# Patient Record
Sex: Male | Born: 1970 | Race: White | Hispanic: No | Marital: Married | Smoking: Former smoker
Health system: Southern US, Community
[De-identification: ages and names within clinical notes are randomized; demographics above are authoritative.]

## PROBLEM LIST (undated history)

## (undated) DIAGNOSIS — I10 Essential (primary) hypertension: Secondary | ICD-10-CM

## (undated) HISTORY — PX: SHOULDER SURGERY: SHX246

---

## 2006-10-06 ENCOUNTER — Ambulatory Visit: Payer: Self-pay | Admitting: Family Medicine

## 2006-10-06 DIAGNOSIS — F909 Attention-deficit hyperactivity disorder, unspecified type: Secondary | ICD-10-CM | POA: Insufficient documentation

## 2006-10-06 DIAGNOSIS — F902 Attention-deficit hyperactivity disorder, combined type: Secondary | ICD-10-CM | POA: Insufficient documentation

## 2006-11-01 ENCOUNTER — Ambulatory Visit: Payer: Self-pay | Admitting: Family Medicine

## 2006-11-01 ENCOUNTER — Encounter: Payer: Self-pay | Admitting: Family Medicine

## 2006-11-22 ENCOUNTER — Ambulatory Visit: Payer: Self-pay | Admitting: Family Medicine

## 2006-11-22 DIAGNOSIS — F341 Dysthymic disorder: Secondary | ICD-10-CM | POA: Insufficient documentation

## 2006-12-28 ENCOUNTER — Ambulatory Visit: Payer: Self-pay | Admitting: Family Medicine

## 2006-12-31 ENCOUNTER — Telehealth (INDEPENDENT_AMBULATORY_CARE_PROVIDER_SITE_OTHER): Payer: Self-pay | Admitting: *Deleted

## 2006-12-31 ENCOUNTER — Encounter: Payer: Self-pay | Admitting: Family Medicine

## 2007-01-07 ENCOUNTER — Encounter: Payer: Self-pay | Admitting: Family Medicine

## 2007-01-07 ENCOUNTER — Ambulatory Visit: Payer: Self-pay | Admitting: Internal Medicine

## 2007-01-10 ENCOUNTER — Encounter: Payer: Self-pay | Admitting: Family Medicine

## 2007-01-10 ENCOUNTER — Ambulatory Visit: Payer: Self-pay | Admitting: Internal Medicine

## 2007-01-10 LAB — HM COLONOSCOPY

## 2007-01-28 ENCOUNTER — Encounter: Payer: Self-pay | Admitting: Family Medicine

## 2007-09-27 ENCOUNTER — Telehealth (INDEPENDENT_AMBULATORY_CARE_PROVIDER_SITE_OTHER): Payer: Self-pay | Admitting: *Deleted

## 2007-10-03 ENCOUNTER — Ambulatory Visit: Payer: Self-pay | Admitting: Family Medicine

## 2008-01-02 ENCOUNTER — Ambulatory Visit: Payer: Self-pay | Admitting: Family Medicine

## 2008-01-02 DIAGNOSIS — I10 Essential (primary) hypertension: Secondary | ICD-10-CM | POA: Insufficient documentation

## 2008-01-02 DIAGNOSIS — F17200 Nicotine dependence, unspecified, uncomplicated: Secondary | ICD-10-CM | POA: Insufficient documentation

## 2008-01-17 ENCOUNTER — Encounter: Payer: Self-pay | Admitting: Family Medicine

## 2008-01-17 LAB — CONVERTED CEMR LAB
Albumin: 4.5 g/dL (ref 3.5–5.2)
CO2: 25 meq/L (ref 19–32)
Calcium: 9.1 mg/dL (ref 8.4–10.5)
Chloride: 105 meq/L (ref 96–112)
Cholesterol: 177 mg/dL (ref 0–200)
Glucose, Bld: 91 mg/dL (ref 70–99)
Potassium: 4.6 meq/L (ref 3.5–5.3)
Sodium: 142 meq/L (ref 135–145)
Total Bilirubin: 0.4 mg/dL (ref 0.3–1.2)
Total Protein: 7.1 g/dL (ref 6.0–8.3)
Triglycerides: 104 mg/dL (ref <150)

## 2008-01-18 ENCOUNTER — Encounter: Payer: Self-pay | Admitting: Family Medicine

## 2008-01-30 ENCOUNTER — Ambulatory Visit: Payer: Self-pay | Admitting: Family Medicine

## 2008-02-06 ENCOUNTER — Telehealth: Payer: Self-pay | Admitting: Family Medicine

## 2008-02-27 ENCOUNTER — Telehealth: Payer: Self-pay | Admitting: Family Medicine

## 2008-03-19 ENCOUNTER — Encounter: Admission: RE | Admit: 2008-03-19 | Discharge: 2008-03-19 | Payer: Self-pay | Admitting: Family Medicine

## 2008-03-19 ENCOUNTER — Ambulatory Visit: Payer: Self-pay | Admitting: Family Medicine

## 2008-03-19 DIAGNOSIS — M25519 Pain in unspecified shoulder: Secondary | ICD-10-CM | POA: Insufficient documentation

## 2008-10-25 ENCOUNTER — Ambulatory Visit: Payer: Self-pay | Admitting: Family Medicine

## 2008-10-25 DIAGNOSIS — M7710 Lateral epicondylitis, unspecified elbow: Secondary | ICD-10-CM | POA: Insufficient documentation

## 2008-12-26 ENCOUNTER — Telehealth: Payer: Self-pay | Admitting: Family Medicine

## 2009-03-13 ENCOUNTER — Encounter: Payer: Self-pay | Admitting: Family Medicine

## 2009-04-08 ENCOUNTER — Telehealth (INDEPENDENT_AMBULATORY_CARE_PROVIDER_SITE_OTHER): Payer: Self-pay | Admitting: *Deleted

## 2009-04-08 ENCOUNTER — Ambulatory Visit: Payer: Self-pay | Admitting: Family Medicine

## 2009-04-08 DIAGNOSIS — M546 Pain in thoracic spine: Secondary | ICD-10-CM | POA: Insufficient documentation

## 2009-04-08 DIAGNOSIS — M545 Low back pain: Secondary | ICD-10-CM

## 2009-04-18 ENCOUNTER — Encounter: Payer: Self-pay | Admitting: Family Medicine

## 2009-04-24 ENCOUNTER — Encounter: Payer: Self-pay | Admitting: Family Medicine

## 2010-01-16 ENCOUNTER — Ambulatory Visit: Payer: Self-pay | Admitting: Family Medicine

## 2010-01-16 DIAGNOSIS — R5381 Other malaise: Secondary | ICD-10-CM | POA: Insufficient documentation

## 2010-01-16 DIAGNOSIS — R5383 Other fatigue: Secondary | ICD-10-CM

## 2010-01-17 ENCOUNTER — Encounter: Payer: Self-pay | Admitting: Family Medicine

## 2010-01-20 LAB — CONVERTED CEMR LAB
Albumin: 4.6 g/dL (ref 3.5–5.2)
BUN: 13 mg/dL (ref 6–23)
Calcium: 9.4 mg/dL (ref 8.4–10.5)
Chloride: 105 meq/L (ref 96–112)
Cholesterol, target level: 200 mg/dL
Glucose, Bld: 88 mg/dL (ref 70–99)
HDL: 40 mg/dL (ref >39)
Hemoglobin: 13.7 g/dL (ref 13.0–17.0)
LDL Cholesterol: 138 mg/dL — ABNORMAL HIGH (ref 0–99)
LDL Goal: 130 mg/dL
Potassium: 4.4 meq/L (ref 3.5–5.3)
RBC: 4.41 M/uL (ref 4.22–5.81)
Sex Hormone Binding: 20 nmol/L (ref 13–71)
Sodium: 142 meq/L (ref 135–145)
TSH: 1.399 microintl units/mL (ref 0.350–4.500)
Testosterone Free: 95.2 pg/mL (ref 47.0–244.0)
Testosterone-% Free: 2.6 % (ref 1.6–2.9)
Total Protein: 7.1 g/dL (ref 6.0–8.3)
Triglycerides: 157 mg/dL — ABNORMAL HIGH (ref <150)
Vit D, 25-Hydroxy: 46 ng/mL (ref 30–89)
WBC: 6.4 10*3/uL (ref 4.0–10.5)

## 2010-01-22 ENCOUNTER — Ambulatory Visit: Payer: Self-pay | Admitting: Family Medicine

## 2010-01-22 DIAGNOSIS — F32A Depression, unspecified: Secondary | ICD-10-CM | POA: Insufficient documentation

## 2010-01-22 DIAGNOSIS — F419 Anxiety disorder, unspecified: Secondary | ICD-10-CM | POA: Insufficient documentation

## 2010-01-22 DIAGNOSIS — F329 Major depressive disorder, single episode, unspecified: Secondary | ICD-10-CM | POA: Insufficient documentation

## 2010-01-29 ENCOUNTER — Ambulatory Visit (HOSPITAL_COMMUNITY): Payer: Self-pay | Admitting: Psychology

## 2010-02-21 ENCOUNTER — Ambulatory Visit (HOSPITAL_COMMUNITY): Payer: Self-pay | Admitting: Psychology

## 2010-02-28 ENCOUNTER — Ambulatory Visit: Payer: Self-pay | Admitting: Family Medicine

## 2010-03-03 ENCOUNTER — Telehealth: Payer: Self-pay | Admitting: Family Medicine

## 2010-03-06 ENCOUNTER — Telehealth: Payer: Self-pay | Admitting: Family Medicine

## 2010-03-06 ENCOUNTER — Ambulatory Visit (HOSPITAL_COMMUNITY): Payer: Self-pay | Admitting: Psychology

## 2010-04-10 ENCOUNTER — Ambulatory Visit: Payer: Self-pay | Admitting: Family Medicine

## 2010-07-21 ENCOUNTER — Ambulatory Visit: Payer: Self-pay | Admitting: Family Medicine

## 2010-08-20 ENCOUNTER — Ambulatory Visit: Payer: Self-pay | Admitting: Family Medicine

## 2010-08-20 DIAGNOSIS — G4700 Insomnia, unspecified: Secondary | ICD-10-CM | POA: Insufficient documentation

## 2010-11-18 ENCOUNTER — Ambulatory Visit: Payer: Self-pay | Admitting: Family Medicine

## 2010-11-18 DIAGNOSIS — R197 Diarrhea, unspecified: Secondary | ICD-10-CM | POA: Insufficient documentation

## 2010-11-19 ENCOUNTER — Encounter: Payer: Self-pay | Admitting: Family Medicine

## 2010-11-20 LAB — CONVERTED CEMR LAB: TSH: 1.458 microintl units/mL (ref 0.350–4.500)

## 2010-11-25 ENCOUNTER — Encounter: Payer: Self-pay | Admitting: Family Medicine

## 2011-01-13 NOTE — Assessment & Plan Note (Signed)
Summary: Fu mood, smoking.   Vital Signs:  Patient profile:   40 year old male Height:      67.5 inches Weight:      172 pounds Pulse rate:   77 / minute BP sitting:   115 / 65  (left arm) Cuff size:   regular  Vitals Entered By: Kathlene November (February 28, 2010 3:42 PM) CC: followup on med. Not as irritable and not as many mood swings since starting the med   Primary Care Provider:  Nani Gasser MD  CC:  followup on med. Not as irritable and not as many mood swings since starting the med.  History of Present Illness: followup on med. Not as irritable and not as many mood swings since starting the med.  Sleeping Ok, occ bouts of insomnia about 3-4 x a week. Not gotten worse. Says noticed his mood has  been better after quitting smoking.  No SE of the medication but not sure how much it is helping.   Started the chantix and then started the mood medication. Has quit smoking.  Only taking once a day becuase was causing gas and constipation but feels this is working well for him. Needs rx for  the continuing pack.   Current Medications (verified): 1)  Lisinopril 20 Mg Tabs (Lisinopril) .... Take 1 Tablet By Mouth Once A Day 2)  Chantix Starting Month Pak 0.5 Mg X 11 & 1 Mg X 42 Tabs (Varenicline Tartrate) .... Take As Directed. 3)  Citalopram Hydrobromide 20 Mg Tabs (Citalopram Hydrobromide) .... Take One Tablet By Mouth Once A Day  Allergies (verified): 1)  Nsaids  Comments:  Nurse/Medical Assistant: The patient's medications and allergies were reviewed with the patient and were updated in the Medication and Allergy Lists. Kathlene November (February 28, 2010 3:43 PM)  Physical Exam  General:  Well-developed,well-nourished,in no acute distress; alert,appropriate and cooperative throughout examination   Impression & Recommendations:  Problem # 1:  DEPRESSION (ICD-311) PHQ-9 is 8 today (mild) which is improved. F/u in 6-8 weeks to make sure improving.  Can consider changing to  zoloft if not working.  Also consider trazodone on the nights night sleeping well. He wants to hold off on this for now becuse his daughter is so yound.   His updated medication list for this problem includes:    Citalopram Hydrobromide 20 Mg Tabs (Citalopram hydrobromide) .Marland Kitchen... Take one tablet by mouth once a day  Problem # 2:  NICOTINE ADDICTION (ICD-305.1) Doing well on the [place]. Will change to the continuing pack. He i sonly taking one pill a day because of the constipation but this seems to be woeking well for him. congratulated him.  His updated medication list for this problem includes:    Chantix Continuing Month Pak 1 Mg Tabs (Varenicline tartrate) .Marland Kitchen... Take as directed.  Complete Medication List: 1)  Lisinopril 20 Mg Tabs (Lisinopril) .... Take 1 tablet by mouth once a day 2)  Chantix Continuing Month Pak 1 Mg Tabs (Varenicline tartrate) .... Take as directed. 3)  Citalopram Hydrobromide 20 Mg Tabs (Citalopram hydrobromide) .... Take one tablet by mouth once a day Prescriptions: CHANTIX CONTINUING MONTH PAK 1 MG TABS (VARENICLINE TARTRATE) Take as directed.  #1 x 1   Entered and Authorized by:   Nani Gasser MD   Signed by:   Nani Gasser MD on 02/28/2010   Method used:   [facility]  [address] [phone]* (retail)  [address]       [place], [place]  [address]       Ph: [phone] or [phone]       Fax: [phone]   RxID:   [id number]

## 2011-01-13 NOTE — Assessment & Plan Note (Signed)
Summary: F/U depression   Vital Signs:  Patient profile:   40 year old male Height:      67.5 inches Weight:      177 pounds Pulse rate:   79 / minute BP sitting:   124 / 77  (right arm) Cuff size:   regular  Vitals Entered By: Avon Gully CMA, (AAMA) (July 21, 2010 1:50 PM) CC: f/u mood, on celexa. can tell a difference in his mood now that he is not on it   Primary Care Provider:  Nani Gasser MD  CC:  f/u mood and on celexa. can tell a difference in his mood now that he is not on it.  History of Present Illness: f/u mood, on celexa. can tell a difference in his mood now that he is not on it.  Ran out of medication so has been off it. Didn't tolerate the 40mg  dose so had dropped back to the 20mg  dose.  Still feels more tired on the medication.    Current Medications (verified): 1)  Lisinopril 20 Mg Tabs (Lisinopril) .... Take 1 Tablet By Mouth Once A Day 2)  Chantix Continuing Month Pak 1 Mg Tabs (Varenicline Tartrate) .... Take As Directed. 3)  Citalopram Hydrobromide 40 Mg Tabs (Citalopram Hydrobromide) .... Take 1 Tablet By Mouth Once A Day 4)  Trazodone Hcl 50 Mg Tabs (Trazodone Hcl) .... 1/2 To 1 Tab By Mouth About 1 Hour Before Bedtime As Needed Insomnia.  Allergies (verified): 1)  Nsaids  Comments:  Nurse/Medical Assistant: The patient's medications and allergies were reviewed with the patient and were updated in the Medication and Allergy Lists. Avon Gully CMA, Duncan Dull) (July 21, 2010 1:52 PM)  Physical Exam  General:  Well-developed,well-nourished,in no acute distress; alert,appropriate and cooperative throughout examination Lungs:  Normal respiratory effort, chest expands symmetrically. Lungs are clear to auscultation, no crackles or wheezes. Heart:  Normal rate and regular rhythm. S1 and S2 normal without gallop, murmur, click, rub or other extra sounds. Skin:  no rashes.   Psych:  Cognition and judgment appear intact. Alert and cooperative  with normal attention span and concentration. No apparent delusions, illusions, hallucinations   Impression & Recommendations:  Problem # 1:  DEPRESSION (ICD-311) Will try changin to fluoxetine. No need to wean since has been off the citalopram. Hopefully will have less fatigue with this medication. f/u in 6 weeks to make sure tolerating well and to adjust the dose.  His updated medication list for this problem includes:    Fluoxetine Hcl 20 Mg Tabs (Fluoxetine hcl) .Marland Kitchen... 1/2 tab by mouth for first week, then increase to a whole tab.    Trazodone Hcl 50 Mg Tabs (Trazodone hcl) .Marland Kitchen... 1/2 to 1 tab by mouth about 1 hour before bedtime as needed insomnia.  Complete Medication List: 1)  Lisinopril 20 Mg Tabs (Lisinopril) .... Take 1 tablet by mouth once a day 2)  Chantix Continuing Month Pak 1 Mg Tabs (Varenicline tartrate) .... Take as directed. 3)  Fluoxetine Hcl 20 Mg Tabs (Fluoxetine hcl) .... 1/2 tab by mouth for first week, then increase to a whole tab. 4)  Trazodone Hcl 50 Mg Tabs (Trazodone hcl) .... 1/2 to 1 tab by mouth about 1 hour before bedtime as needed insomnia.  Patient Instructions: 1)  Please schedule a follow-up appointment in 1 month.  Prescriptions: FLUOXETINE HCL 20 MG TABS (FLUOXETINE HCL) 1/2 tab by mouth for first week, then increase to a whole tab.  #30 x 1  Entered and Authorized by:   Nani Gasser MD   Signed by:   Nani Gasser MD on 07/21/2010   Method used:   Electronically to        [facility] [phone]* (retail)       [address] [place], [place]  [address]       Ph: [phone]       Fax: [phone]   RxID:   [id number] LISINOPRIL 20 MG TABS (LISINOPRIL) Take 1 tablet by mouth once a day  #90 Each x 3   Entered by:   Avon Gully CMA, (AAMA)   Authorized by:   Nani Gasser MD   Signed by:   Nani Gasser MD on 07/21/2010   Method used:   Electronically to        [facility] [phone]* (retail)       [address] [place], [place]  [address]       Ph: [phone]       Fax: [phone]   RxID:   [id number]

## 2011-01-13 NOTE — Assessment & Plan Note (Signed)
Summary: fup on labs, depression   Vital Signs:  Patient profile:   40 year old male Height:      67.5 inches Weight:      172 pounds BMI:     26.64 O2 Sat:      98 % on Room air Temp:     98.2 degrees F oral Pulse rate:   79 / minute BP sitting:   123 / 74  (left arm) Cuff size:   regular  Vitals Entered By: Payton Spark CMA (January 22, 2010 4:16 PM)  O2 Flow:  Room air CC: F/U to discuss labs   Primary Care Provider:  Nani Gasser MD  CC:  F/U to discuss labs.  History of Present Illness: Here to f/u mood and discuss lab results. Labs were fairly normal but did screen + for depression and possible Biploar. Had tried fluoxetine in the past but only for about 2-3 weeks adn then stoppped it.   Current Medications (verified): 1)  Lisinopril 20 Mg Tabs (Lisinopril) .... Take 1 Tablet By Mouth Once A Day 2)  Skelaxin 800 Mg Tabs (Metaxalone) .... Take 1 Tablet By Mouth Three Times A Day As Needed Muscle Spasm 3)  Hydrocodone-Acetaminophen 5-500 Mg Tabs (Hydrocodone-Acetaminophen) .... Take 1 Tablet By Mouth Once A Day At Bedtime As Needed For Severe Back Pain. 4)  Chantix Starting Month Pak 0.5 Mg X 11 & 1 Mg X 42 Tabs (Varenicline Tartrate) .... Take As Directed.  Allergies (verified): 1)  Nsaids  Physical Exam  General:  Well-developed,well-nourished,in no acute distress; alert,appropriate and cooperative throughout examination Head:  Normocephalic and atraumatic without obvious abnormalities. No apparent alopecia or balding. Psych:  Cognition and judgment appear intact. Alert and cooperative with normal attention span and concentration. No apparent delusions, illusions, hallucinations   Impression & Recommendations:  Problem # 1:  DEPRESSION (ICD-311) Assessment New Discussed opitns. Will start with an SSRI. FU in 3-4 weeks. Discussed potential SE.  Call if any concerns or signs of mania. If SE or not working well consider change to zoloft.  Explained to pt  that may take several weeks to start working.  His updated medication list for this problem includes:    Citalopram Hydrobromide 20 Mg Tabs (Citalopram hydrobromide) .Marland Kitchen... 1/2 tab by mouth the first week, then increase to whole tab  Complete Medication List: 1)  Lisinopril 20 Mg Tabs (Lisinopril) .... Take 1 tablet by mouth once a day 2)  Skelaxin 800 Mg Tabs (Metaxalone) .... Take 1 tablet by mouth three times a day as needed muscle spasm 3)  Hydrocodone-acetaminophen 5-500 Mg Tabs (Hydrocodone-acetaminophen) .... Take 1 tablet by mouth once a day at bedtime as needed for severe back pain. 4)  Chantix Starting Month Pak 0.5 Mg X 11 & 1 Mg X 42 Tabs (Varenicline tartrate) .... Take as directed. 5)  Citalopram Hydrobromide 20 Mg Tabs (Citalopram hydrobromide) .... 1/2 tab by mouth the first week, then increase to whole tab Prescriptions: CITALOPRAM HYDROBROMIDE 20 MG TABS (CITALOPRAM HYDROBROMIDE) 1/2 tab by mouth the first week, then increase to whole tab  #30 x 0   Entered and Authorized by:   Nani Gasser MD   Signed by:   Nani Gasser MD on 01/22/2010   Method used:   [facility]  [address] [phone]* (retail)       [address]       [place], [place]  [address]  Ph: [phone] or [phone]       Fax: [phone]   RxID:   [id number]   Appended Document: fup on labs, depression

## 2011-01-13 NOTE — Assessment & Plan Note (Signed)
Summary: mood, insomnia   Vital Signs:  Patient profile:   40 year old male Height:      67.5 inches Weight:      176 pounds Pulse rate:   86 / minute BP sitting:   129 / 86  (right arm) Cuff size:   regular  Vitals Entered By: Avon Gully CMA, Duncan Dull) (August 20, 2010 2:19 PM) CC: F/u depression   Primary Care Provider:  Nani Gasser MD  CC:  F/u depression.  History of Present Illness: He feels his mood has leveled out. Less angry feeling.  Still have trouble sleeping at times. Still feels fatigued. When busy doesn't feel tired.  Gets about 6 hours of sleep a night.  Will usually wake up at night. OK falling asleep.   Current Medications (verified): 1)  Lisinopril 20 Mg Tabs (Lisinopril) .... Take 1 Tablet By Mouth Once A Day 2)  Chantix Continuing Month Pak 1 Mg Tabs (Varenicline Tartrate) .... Take As Directed. 3)  Fluoxetine Hcl 20 Mg Tabs (Fluoxetine Hcl) .... 1/2 Tab By Mouth For First Week, Then Increase To A Whole Tab. 4)  Trazodone Hcl 50 Mg Tabs (Trazodone Hcl) .... 1/2 To 1 Tab By Mouth About 1 Hour Before Bedtime As Needed Insomnia.  Allergies (verified): 1)  Nsaids  Comments:  Nurse/Medical Assistant: The patient's medications and allergies were reviewed with the patient and were updated in the Medication and Allergy Lists. Avon Gully CMA, Duncan Dull) (August 20, 2010 2:20 PM)  Physical Exam  General:  Well-developed,well-nourished,in no acute distress; alert,appropriate and cooperative throughout examination Lungs:  Normal respiratory effort, chest expands symmetrically. Lungs are clear to auscultation, no crackles or wheezes. Heart:  Normal rate and regular rhythm. S1 and S2 normal without gallop, murmur, click, rub or other extra sounds.   Impression & Recommendations:  Problem # 1:  DEPRESSION (ICD-311) Overal much improved. PHQ-9 score is 4 today. He is in remission. Discussed that studies support staying onthe medication for 6-12  month to reduce chance of release. He feels he still has roomfor improvement so will increase to 40mg  for one month. F/U in 2 months. Can call and drop back down to 20mg  if wants after one month.  His updated medication list for this problem includes:    Fluoxetine Hcl 40 Mg Caps (Fluoxetine hcl) .Marland Kitchen... Take 1 tablet by mouth once a day    Trazodone Hcl 50 Mg Tabs (Trazodone hcl) .Marland Kitchen... 1/2 to 1 tab by mouth about 1 hour before bedtime as needed insomnia.  Problem # 2:  INSOMNIA (ICD-780.52) Try increaseing teh trazodone to whole tab. If not helping then consider 100mg  dose and if that not helping change to [place]. I think his fatigued is directly tied to his lack of sleep.   Complete Medication List: 1)  Lisinopril 20 Mg Tabs (Lisinopril) .... Take 1 tablet by mouth once a day 2)  Chantix Continuing Month Pak 1 Mg Tabs (Varenicline tartrate) .... Take as directed. 3)  Fluoxetine Hcl 40 Mg Caps (Fluoxetine hcl) .... Take 1 tablet by mouth once a day 4)  Trazodone Hcl 50 Mg Tabs (Trazodone hcl) .... 1/2 to 1 tab by mouth about 1 hour before bedtime as needed insomnia.  Patient Instructions: 1)  Increase the trazodone to 50mg  nightly. 2)  Please schedule a follow-up appointment in 2 months for mood.  Prescriptions: TRAZODONE HCL 50 MG TABS (TRAZODONE HCL) 1/2 to 1 tab by mouth about 1 hour before bedtime as needed  insomnia.  #30 Tablet x 2   Entered and Authorized by:   Nani Gasser MD   Signed by:   Nani Gasser MD on 08/20/2010   Method used:   Electronically to        [facility] [phone]* (retail)       [address] [place], [place]  [address]       Ph: [phone]       Fax: [phone]   RxID:   [id number] FLUOXETINE HCL 40 MG CAPS (FLUOXETINE HCL) Take 1 tablet by mouth once a day  #30 x 2   Entered and Authorized by:   Nani Gasser MD   Signed by:   Nani Gasser MD on 08/20/2010   Method used:   Electronically to        [facility] [phone]* (retail)       [address] [place], [place]  [address]       Ph: [phone]       Fax: [phone]   RxID:   [id number]

## 2011-01-13 NOTE — Progress Notes (Signed)
Summary: Increase med dose  Phone Note Call from Patient Call back at Home Phone [phone]   Caller: Patient Call For: Nani Gasser MD Summary of Call: Pt calls and states that you both had talked about increasing the Citalopram to 40mg  but pharmacy did not get this. Pt would like the 40mg  [facility] Initial call taken by: Kathlene November,  March 03, 2010 2:50 PM    New/Updated Medications: CITALOPRAM HYDROBROMIDE 40 MG TABS (CITALOPRAM HYDROBROMIDE) Take 1 tablet by mouth once a day Prescriptions: CITALOPRAM HYDROBROMIDE 40 MG TABS (CITALOPRAM HYDROBROMIDE) Take 1 tablet by mouth once a day  #30 x 1   Entered and Authorized by:   Nani Gasser MD   Signed by:   Nani Gasser MD on 03/03/2010   Method used:   [facility] [phone]* (retail)       [address]       [place], [place]  [address]       Ph: [phone] or [phone]       Fax: [phone]   RxID:   [id number]

## 2011-01-13 NOTE — Assessment & Plan Note (Signed)
Summary: HTN, Toba abuse, fatigue , SKs   Vital Signs:  Patient profile:   40 year old male Height:      67.5 inches Weight:      176 pounds BMI:     27.26 O2 Sat:      96 % on Room air Temp:     98.2 degrees F oral Pulse rate:   93 / minute BP sitting:   139 / 70  (right arm)  Vitals Entered By: Selena Batten Johnson/april (January 16, 2010 3:19 PM)  O2 Flow:  Room air CC: f/u on meds, Hypertension Management   Primary Care Provider:  Nani Gasser MD  CC:  f/u on meds and Hypertension Management.  History of Present Illness: smoking again. did try to quit but was unsuccessful.  Would like to retry the Chantix.  Has a new daughter. Says her daughter didn't bond well with her.   Moody, weight gain, fatigue, decreased libido.  Dec muscle strength. Would like testosterone checked. No urianary signs.    Red spot on his nose for about 2 years. Has gotten bigger but thinks may have scratched it.  Never peels  off. bleeds easily.  No big changes.    Hypertension History:      He denies headache, chest pain, palpitations, dyspnea with exertion, orthopnea, PND, peripheral edema, visual symptoms, neurologic problems, syncope, and side effects from treatment.  He notes no problems with any antihypertensive medication side effects.  Getting SBP 125 at home. Marland Kitchen        Positive major cardiovascular risk factors include hypertension and family history for ischemic heart disease (females less than 71 years old & males less than 25 years old).  Negative major cardiovascular risk factors include male age less than 87 years old and non-tobacco-user status.    Current Medications (verified): 1)  Lisinopril 20 Mg Tabs (Lisinopril) .... Take 1 Tablet By Mouth Once A Day 2)  Skelaxin 800 Mg Tabs (Metaxalone) .... Take 1 Tablet By Mouth Three Times A Day As Needed Muscle Spasm 3)  Hydrocodone-Acetaminophen 5-500 Mg Tabs (Hydrocodone-Acetaminophen) .... Take 1 Tablet By Mouth Once A Day At Bedtime As Needed  For Severe Back Pain.  Allergies (verified): 1)  Nsaids  Social History: Reviewed history from 10/25/2008 and no changes required. 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  Married: Ashutosh Dieguez with no kids Former Smoker Alcohol use-yes Drug use-no Regular exercise-yes 2.5 cups caffeine daily  Physical Exam  General:  Well-developed,well-nourished,in no acute distress; alert,appropriate and cooperative throughout examination Lungs:  Normal respiratory effort, chest expands symmetrically. Lungs are clear to auscultation, no crackles or wheezes. Heart:  Normal rate and regular rhythm. S1 and S2 normal without gallop, murmur, click, rub or other extra sounds. Skin:  2 erythematous papular fine lesions on his nose. ON e onthe bride and onr on the left side.  One on bridge has a small scab from scratching.  Psych:  Cognition and judgment appear intact. Alert and cooperative with normal attention span and concentration. No apparent delusions, illusions, hallucinations   Impression & Recommendations:  Problem # 1:  ESSENTIAL HYPERTENSION, BENIGN (ICD-401.1)  Borderline elevated. Getting good BP at home so maintain current dose.  Work on quitting smoking.  Has gained a few pounds as well. Discussed working on some mild weight loss.   His updated medication list for this problem includes:    Lisinopril 20 Mg Tabs (Lisinopril) .Marland Kitchen... Take 1 tablet by mouth once a day  Orders: T-Lipid Profile ([id number])  Problem # 2:  NICOTINE ADDICTION (ICD-305.1) Pt would like to restart chantix.  I think this would be helpful and he did well on it almost  a year and a half ago. Will refill. Call if causes any changes in his mood.  His updated medication list for this problem includes:    Chantix Starting Month Pak 0.5 Mg X 11 & 1 Mg X 42 Tabs (Varenicline tartrate) .Marland Kitchen... Take as directed.  Problem # 3:  FATIGUE (ICD-780.79) Will check thyroid and testosterone.  Will rule  out anemia as well.  Also did a screen for Bipolar that was + and a depression screen that was + for 10 points (Moderate). Discussed that we really need to consider these as a possible explaination for his mood, irritability and insomnia. May also be that has the "baby blues" with his new baby.   Orders: T-CBC No Diff ([id number]) T-Comprehensive Metabolic Panel [phone]) T-TSH [phone]) T-Testosterone, Free and Total [phone]) T-Vitamin B12 [phone]) T-Vitamin D (25-Hydroxy) [phone])  Problem # 4:  SEBORRHEIC KERATOSIS (ICD-702.19)  Cryotherapy performed on 2  lesions on his nose. Tolerated well. F/U wound care reviewed.   Orders: Cryotherapy/Destruction benign or premalignant lesion (1st lesion)  (17000) Cryotherapy/Destruction benign or premalignant lesion (2nd-14th lesions) (17003)  Complete Medication List: 1)  Lisinopril 20 Mg Tabs (Lisinopril) .... Take 1 tablet by mouth once a day 2)  Skelaxin 800 Mg Tabs (Metaxalone) .... Take 1 tablet by mouth three times a day as needed muscle spasm 3)  Hydrocodone-acetaminophen 5-500 Mg Tabs (Hydrocodone-acetaminophen) .... Take 1 tablet by mouth once a day at bedtime as needed for severe back pain. 4)  Chantix Starting Month Pak 0.5 Mg X 11 & 1 Mg X 42 Tabs (Varenicline tartrate) .... Take as directed.  Hypertension Assessment/Plan:      The patient's hypertensive risk group is category B: At least one risk factor (excluding diabetes) with no target organ damage.  His calculated 10 year risk of coronary heart disease is 6 %.  Today's blood pressure is 139/70.    Prescriptions: CHANTIX STARTING MONTH PAK 0.5 MG X 11 & 1 MG X 42 TABS (VARENICLINE TARTRATE) Take as directed.  #1 pack x 0   Entered and Authorized by:   Nani Gasser MD   Signed by:   Nani Gasser MD on 01/16/2010   Method used:   [facility] [phone]* (retail)       [address]       [place],  [place]  [address]       Ph: [phone] or [phone]       Fax: [phone]   RxID:   [id number]

## 2011-01-13 NOTE — Letter (Signed)
Summary: Depression Questionnaire  Depression Questionnaire   Imported By: Lanelle Bal 08/28/2010 11:45:01  _____________________________________________________________________  External Attachment:    Type:   Image     Comment:   External Document

## 2011-01-13 NOTE — Assessment & Plan Note (Signed)
Summary: 2 -Mole removal on back/neck   Vital Signs:  Patient profile:   40 year old male Height:      67.5 inches Weight:      175 pounds Pulse rate:   70 / minute BP sitting:   116 / 59  (left arm) Cuff size:   regular  Vitals Entered By: Kathlene November (April 10, 2010 3:51 PM) CC: mole removal on back and neck   Primary Care Provider:  Nani Gasser MD  CC:  mole removal on back and neck.  History of Present Illness: mole removal on back and neck  Current Medications (verified): 1)  Lisinopril 20 Mg Tabs (Lisinopril) .... Take 1 Tablet By Mouth Once A Day 2)  Chantix Continuing Month Pak 1 Mg Tabs (Varenicline Tartrate) .... Take As Directed. 3)  Citalopram Hydrobromide 40 Mg Tabs (Citalopram Hydrobromide) .... Take 1 Tablet By Mouth Once A Day 4)  Trazodone Hcl 50 Mg Tabs (Trazodone Hcl) .... 1/2 To 1 Tab By Mouth About 1 Hour Before Bedtime As Needed Insomnia.  Allergies (verified): 1)  Nsaids  Comments:  Nurse/Medical Assistant: The patient's medications and allergies were reviewed with the patient and were updated in the Medication and Allergy Lists. Kathlene November (April 10, 2010 3:52 PM)   Impression & Recommendations:  Problem # 1:  NEVUS, ATYPICAL (ICD-216.9)  Bx performed. Sample sent to pathology. F/U wound care reviewed.   Orders: Shave Skin Lesion < 0.5 cm/trunk/arm/leg (11300)  Complete Medication List: 1)  Lisinopril 20 Mg Tabs (Lisinopril) .... Take 1 tablet by mouth once a day 2)  Chantix Continuing Month Pak 1 Mg Tabs (Varenicline tartrate) .... Take as directed. 3)  Citalopram Hydrobromide 40 Mg Tabs (Citalopram hydrobromide) .... Take 1 tablet by mouth once a day 4)  Trazodone Hcl 50 Mg Tabs (Trazodone hcl) .... 1/2 to 1 tab by mouth about 1 hour before bedtime as needed insomnia.  Procedure Note Last Tetanus: Historical (12/15/2003)  Mole Biopsy/Removal: Indication: changing lesion  Procedure # 1: shave biopsy    Size (in cm): 0.3 x  0.3    Region: right upper back.     Instrument used: double blade     Anesthesia: 1% lidocaine w/epinephrine  Procedure # 2: shave biopsy    Size (in cm): 0.3  x 0.3     Region: midline neck.     Instrument used: double blade.     Anesthesia: 1% lidocaine w/epinephrine  Cleaned and prepped with: alcohol and betadine Wound dressing: bacitracin Additional Instructions: F/U wound care reviewed. Aluminum chloride used to acheive hemostasis.   Appended Document: 2 -Mole removal on back/neck Call pt: lesion on back is a irritated seborrheic keratosis (benign) and the lesion on his neck was a irritated mealnocytic nevus (pigmented mole that is inflammed.   No further f/u of these lesions needed. May  2, 20114:07 PM Vanecia Limpert MD, Santina Evans  04/14/2010 @ 4:10pm-Pt notified of results. KJ LPN

## 2011-01-13 NOTE — Progress Notes (Signed)
Summary: Wants trazadone  Phone Note Call from Patient Call back at Home Phone [phone]   Caller: Patient Call For: Nani Gasser MD Summary of Call: Pt called and would like to try the Trazadone as you all had discussed Initial call taken by: Kathlene November,  March 06, 2010 10:31 AM  Follow-up for Phone Call        Rx Called In Follow-up by: Nani Gasser MD,  March 06, 2010 10:51 AM  Additional Follow-up for Phone Call Additional follow up Details #1::        Pt notified med sent Additional Follow-up by: Kathlene November,  March 06, 2010 10:56 AM    New/Updated Medications: TRAZODONE HCL 50 MG TABS (TRAZODONE HCL) 1/2 to 1 tab by mouth about 1 hour before bedtime as needed insomnia. Prescriptions: TRAZODONE HCL 50 MG TABS (TRAZODONE HCL) 1/2 to 1 tab by mouth about 1 hour before bedtime as needed insomnia.  #30 x 0   Entered and Authorized by:   Nani Gasser MD   Signed by:   Nani Gasser MD on 03/06/2010   Method used:   Electronically to        [facility] [phone]* (retail)       [address]       [place], [place]  [address]       Ph: [phone] or [phone]       Fax: [phone]   RxID:   [id number]

## 2011-01-13 NOTE — Letter (Signed)
Summary: Depression Questionnaire/[place] Robert Aguilar  Depression Questionnaire/[place] Robert Aguilar   Imported By: Lanelle Bal 03/05/2010 14:09:22  _____________________________________________________________________  External Attachment:    Type:   Image     Comment:   External Document

## 2011-01-13 NOTE — Letter (Signed)
Summary: Depression Questionnaire/[place] Kathryne Sharper  Depression Questionnaire/[place] Kathryne Sharper   Imported By: Lanelle Bal 01/24/2010 13:29:31  _____________________________________________________________________  External Attachment:    Type:   Image     Comment:   External Document

## 2011-01-13 NOTE — Assessment & Plan Note (Signed)
Summary: depression, diarrhea   Vital Signs:  Patient profile:   40 year old male Height:      67.5 inches Weight:      173 pounds Pulse rate:   65 / minute BP sitting:   121 / 74  (right arm) Cuff size:   regular  Vitals Entered By: Avon Gully CMA, Duncan Dull) (November 18, 2010 1:27 PM) CC: f/u mood,cant tell a difference with the medication   Primary Care Provider:  Nani Gasser MD  CC:  f/u mood and cant tell a difference with the medication.  History of Present Illness: f/u mood,cant tell a difference between teh 40mg  and 20mg  doses of  the medication.  Hs lost 3 lbs.    Alot of foods give him gas, bloating and diarrhea. Has been getting worse over the last few years.  Did try probiotic didn't help. Has never been tested for gluten intolerance.  No bloodi n the stool recently but has noticed some a few times.  When eats a low carb diet it is better. Worse with ice cream.  Occ will get crampy and then have has a BM and most of hthe time will feel better afterwards. Had a colonosocpy in 2007/2008 for blood in the stool. he has no abdominal pain today.  He says certain foods do seem to bother his stomach but then at other times he can eat them and has never problems.  Current Medications (verified): 1)  Lisinopril 20 Mg Tabs (Lisinopril) .... Take 1 Tablet By Mouth Once A Day 2)  Chantix Continuing Month Pak 1 Mg Tabs (Varenicline Tartrate) .... Take As Directed. 3)  Fluoxetine Hcl 40 Mg Caps (Fluoxetine Hcl) .... Take 1 Tablet By Mouth Once A Day 4)  Trazodone Hcl 50 Mg Tabs (Trazodone Hcl) .... 1/2 To 1 Tab By Mouth About 1 Hour Before Bedtime As Needed Insomnia.  Allergies (verified): 1)  Nsaids  Comments:  Nurse/Medical Assistant: The patient's medications and allergies were reviewed with the patient and were updated in the Medication and Allergy Lists. Avon Gully CMA, Duncan Dull) (November 18, 2010 1:28 PM)  Physical Exam  General:   Well-developed,well-nourished,in no acute distress; alert,appropriate and cooperative throughout examination Head:  Normocephalic and atraumatic without obvious abnormalities. No apparent alopecia or balding.   Impression & Recommendations:  Problem # 1:  DEPRESSION (ICD-311) PHQ-9 score of 4. since he has not noticed any significant difference between 20-mg and 40 mg S. we will drop back down to 20 mg. follow-up in 3 to 4 months.  Discussed the expectations.  Mr. anywhere between 6 to 12 months total. His updated medication list for this problem includes:    Fluoxetine Hcl 20 Mg Tabs (Fluoxetine hcl) .Marland Kitchen... Take 1 tablet by mouth once a day    Trazodone Hcl 50 Mg Tabs (Trazodone hcl) .Marland Kitchen... 1/2 to 1 tab by mouth about 1 hour before bedtime as needed insomnia.  Problem # 2:  DIARRHEA (ICD-787.91) certainly consider that he could have a lactose intolerance or gluten sensitivity.  We will do blood work for these.  His last colonoscopy was 3 to 4 years ago and was normal.  Since his symptoms have worsened I do recommend a repeat colonoscopy or at least a referral to GI for further evaluation if his lab work is normal.  We also discussed that he could do a one-month trial of a gluten free diet to see if he feels better even if this test is negative.  The probiotics  were not helpful. Orders: T-TSH ([id number]) T-Gliadin Peptide Antibodies, IgG, LgA ([id number]) T- * Misc. Laboratory test [phone])  Complete Medication List: 1)  Lisinopril 20 Mg Tabs (Lisinopril) .... Take 1 tablet by mouth once a day 2)  Chantix Continuing Month Pak 1 Mg Tabs (Varenicline tartrate) .... Take as directed. 3)  Fluoxetine Hcl 20 Mg Tabs (Fluoxetine hcl) .... Take 1 tablet by mouth once a day 4)  Trazodone Hcl 50 Mg Tabs (Trazodone hcl) .... 1/2 to 1 tab by mouth about 1 hour before bedtime as needed insomnia.  Patient Instructions: 1)  Please schedule a follow-up appointment in 4 months for mood. 2)  We will call  you with the lab results.   Prescriptions: FLUOXETINE HCL 20 MG TABS (FLUOXETINE HCL) Take 1 tablet by mouth once a day  #90 x 1   Entered and Authorized by:   Nani Gasser MD   Signed by:   Nani Gasser MD on 11/18/2010   Method used:   Electronically to        [facility] [phone]* (retail)       [address] [place], [place]  [address]       Ph: [phone]       Fax: [phone]   RxID:   [id number]    Orders Added: 1)  T-TSH [phone] 2)  T-Gliadin Peptide Antibodies, IgG, LgA [[id number]] 3)  T- * Misc. Laboratory test [99999] 4)  Est. Patient Level IV [57322]

## 2011-01-15 NOTE — Consult Note (Signed)
Summary: [facility]   Imported By: Lanelle Bal 12/18/2010 12:44:33  _____________________________________________________________________  External Attachment:    Type:   Image     Comment:   External Document

## 2011-01-15 NOTE — Letter (Signed)
Summary: Depression Questionnaire  Depression Questionnaire   Imported By: Lanelle Bal 11/28/2010 11:35:11  _____________________________________________________________________  External Attachment:    Type:   Image     Comment:   External Document

## 2011-01-27 ENCOUNTER — Ambulatory Visit (INDEPENDENT_AMBULATORY_CARE_PROVIDER_SITE_OTHER): Payer: PRIVATE HEALTH INSURANCE | Admitting: Family Medicine

## 2011-01-27 ENCOUNTER — Encounter: Payer: Self-pay | Admitting: Family Medicine

## 2011-01-27 DIAGNOSIS — J019 Acute sinusitis, unspecified: Secondary | ICD-10-CM

## 2011-01-29 ENCOUNTER — Encounter: Payer: Self-pay | Admitting: Family Medicine

## 2011-02-04 NOTE — Assessment & Plan Note (Signed)
Summary: Sinusitis   Vital Signs:  Patient profile:   40 year old male Height:      67.5 inches Weight:      183 pounds Temp:     98.3 degrees F oral Pulse rate:   76 / minute BP sitting:   117 / 74  (right arm) Cuff size:   regular  Vitals Entered By: Avon Gully CMA, (AAMA) (January 27, 2011 1:01 PM) CC: pressure in ears and sinus congestion x 2 1/2 weeks   Primary Care Provider:  Nani Gasser MD  CC:  pressure in ears and sinus congestion x 2 1/2 weeks.  History of Present Illness: Mostly in the left side of nose and face and pressure in his right ear.  No fever. Now with yellow mucous. Cough from driange.  No GI sxs.  Taking lozenges and IBU.  now bilat ear pressure an sinus pressure today.   Current Medications (verified): 1)  Lisinopril 20 Mg Tabs (Lisinopril) .... Take 1 Tablet By Mouth Once A Day 2)  Chantix Continuing Month Pak 1 Mg Tabs (Varenicline Tartrate) .... Take As Directed. 3)  Fluoxetine Hcl 20 Mg Tabs (Fluoxetine Hcl) .... Take 1 Tablet By Mouth Once A Day 4)  Trazodone Hcl 50 Mg Tabs (Trazodone Hcl) .... 1/2 To 1 Tab By Mouth About 1 Hour Before Bedtime As Needed Insomnia.  Allergies (verified): 1)  Nsaids  Comments:  Nurse/Medical Assistant: The patient's medications and allergies were reviewed with the patient and were updated in the Medication and Allergy Lists. Avon Gully CMA, Duncan Dull) (January 27, 2011 1:02 PM)  Physical Exam  General:  Well-developed,well-nourished,in no acute distress; alert,appropriate and cooperative throughout examination Head:  Normocephalic and atraumatic without obvious abnormalities. No apparent alopecia or balding. Eyes:  No corneal or conjunctival inflammation noted. EOMI. Perrla.  Ears:  External ear exam shows no significant lesions or deformities.  Otoscopic examination reveals clear canals, tympanic membranes are intact bilaterally without bulging, retraction, inflammation or discharge. Hearing is  grossly normal bilaterally. Nose:  External nasal examination shows no deformity or inflammation.  Mouth:  Oral mucosa and oropharynx without lesions or exudates.  Teeth in good repair. Neck:  No deformities, masses, or tenderness noted. Lungs:  Normal respiratory effort, chest expands symmetrically. Lungs are clear to auscultation, no crackles or wheezes. Heart:  Normal rate and regular rhythm. S1 and S2 normal without gallop, murmur, click, rub or other extra sounds. Skin:  no rashes.   Cervical Nodes:  No lymphadenopathy noted Psych:  Cognition and judgment appear intact. Alert and cooperative with normal attention span and concentration. No apparent delusions, illusions, hallucinations   Impression & Recommendations:  Problem # 1:  SINUSITIS - ACUTE-NOS (ICD-461.9)  His updated medication list for this problem includes:    Amoxicillin 875 Mg Tabs (Amoxicillin) .Marland Kitchen... Take 1 tablet by mouth two times a day for 10 days  Instructed on treatment. Call if symptoms persist or worsen.   Complete Medication List: 1)  Lisinopril 20 Mg Tabs (Lisinopril) .... Take 1 tablet by mouth once a day 2)  Chantix Continuing Month Pak 1 Mg Tabs (Varenicline tartrate) .... Take as directed. 3)  Fluoxetine Hcl 20 Mg Tabs (Fluoxetine hcl) .... Take 1 tablet by mouth once a day 4)  Trazodone Hcl 50 Mg Tabs (Trazodone hcl) .... 1/2 to 1 tab by mouth about 1 hour before bedtime as needed insomnia. 5)  Amoxicillin 875 Mg Tabs (Amoxicillin) .... Take 1 tablet by mouth two times a day for  10 days Prescriptions: AMOXICILLIN 875 MG TABS (AMOXICILLIN) Take 1 tablet by mouth two times a day for 10 days  #20 x 0   Entered and Authorized by:   Nani Gasser MD   Signed by:   Nani Gasser MD on 01/27/2011   Method used:   Electronically to        [facility] [phone]* (retail)       [address] [place], [place]  [address]       Ph: [phone]       Fax: [phone]   RxID:    [id number]    Orders Added: 1)  Est. Patient Level III [16606]

## 2011-02-19 NOTE — Consult Note (Signed)
Summary: [facility]   Imported By: Lanelle Bal 02/13/2011 09:34:43  _____________________________________________________________________  External Attachment:    Type:   Image     Comment:   External Document

## 2011-04-16 ENCOUNTER — Encounter: Payer: Self-pay | Admitting: Family Medicine

## 2011-04-16 ENCOUNTER — Ambulatory Visit (INDEPENDENT_AMBULATORY_CARE_PROVIDER_SITE_OTHER): Payer: PRIVATE HEALTH INSURANCE | Admitting: Family Medicine

## 2011-04-16 DIAGNOSIS — M7710 Lateral epicondylitis, unspecified elbow: Secondary | ICD-10-CM

## 2011-04-16 DIAGNOSIS — F341 Dysthymic disorder: Secondary | ICD-10-CM

## 2011-04-16 MED ORDER — FLUOXETINE HCL 10 MG PO CAPS: ORAL_CAPSULE | ORAL | Status: DC

## 2011-04-16 NOTE — Progress Notes (Signed)
  Subjective:    Patient ID: Robert Aguilar, male    DOB: [birth year], 40 y.o.   MRN: [id number]  HPI Dong well overall.  Taking the Chantix. Restarted it about 2 months ago. Taking once daily. Has trouble staying asleep and falling sleep.  Wife says he is a little more irritable but no major mood change. He is ready to come off the fluoxetine.   Right elbow pain again. Had some back in 2010 and had an injection at that time.  Noticing his has been using left arm more to compensate. He would like to have another injection today. Review of Systems     Objective:   Physical Exam  Constitutional: He appears well-developed and well-nourished.  HENT:  Head: Normocephalic and atraumatic.  Cardiovascular: Normal rate, regular rhythm and normal heart sounds.   Pulmonary/Chest: Effort normal and breath sounds normal.  Musculoskeletal:       Right elbow is tender over the lateral epicondyle. No sig pain with pronation or supination. Good strength. No swelling or weythema.        The right lateral epicondyle was cleaned with iodine and alcohol. 1 cc of lidocaine and 1 cc of 10 mg Kenalog was injected just distally to the lateral epicondyle. I did use a pinch technique to try to avoid atrophy. I did warn him that he could have some atrophy over the area because of the steroid that injected. Patient understood. Patient tolerated well.   Assessment & Plan:

## 2011-04-16 NOTE — Assessment & Plan Note (Signed)
The patient tolerated the injection well. Discussed icing the area and also gave her handout on some stretches to start doing in the next 2-3 days. He can call if his symptoms are not significantly improved in the next 3 or 4 weeks. At that point we can refer her for physical therapy or possible orthopedics.

## 2011-04-16 NOTE — Assessment & Plan Note (Signed)
PHQ- 9 score of 1 today and that is for sleep (which he feels is secondary to his Chantix); he is ready to wean. Wrote out a taper for him. May want to wait until completes his chantix before weaning.

## 2011-04-16 NOTE — Assessment & Plan Note (Signed)
Will inject for pain relief. H.O give on exercises to do. Ice the area bid fo the next couple of days. Can start the exercises in about 3 days. If not getting better over the next 4-6 weeks then let me know and can refer to PT or ortho.

## 2011-04-16 NOTE — Patient Instructions (Addendum)
Fluoxetine 10 mg daily for 2 weeks then every other day for 2 weeks then every 3rd day for 2 weeks and then stop.  Ice the elbow tid for the next 2-3 days. Can start the exercises after that If not better in the next 4-6 weeks then can refer to PT.

## 2011-05-01 NOTE — Assessment & Plan Note (Signed)
[facility]                         GASTROENTEROLOGY OFFICE NOTE   Robert Aguilar, Robert Aguilar                       MRN:          [id number]  DATE:01/07/2007                            DOB:          08-26-1971    REFERRING PHYSICIAN:  Nani Gasser, M.D.   REASON FOR CONSULTATION:  GI bleeding.   ASSESSMENT:  40 year old white male with intermittent bleeding per  rectum.  He has had blood mixed in with his stool and dripping into the  toilet.  It has been bright red.  Over the years, he has had some crampy  abdominal pain and urgent defecation and he has had crampy abdominal  pain associated with this spell. He could have anorectal bleeding, i.e.,  hemorrhoids, colorectal neoplasia is possible, as is inflammatory bowel  disease.   PLAN:  1. Schedule colonoscopy.  2. Continue to minimize caffeine.  3. I think he can stop his empiric Protonix at this point.  He does      seem improved and I suspect this is a lower process.   HISTORY:  A 40 year old white man, a respiratory therapist at [employer], who describes a history of rectal bleeding about a year  ago using some NSAIDS.  And then lately, he was taking a lot of NSAIDS,  drinking a fair amount of caffeine and developed some crampy abdominal  pain.  He has some urgent defection at times which is chronic.  His wife  has said he has had that for years.  This time, he had blood mixed in  with his stools as well as bright red blood dripping after a bowel  movement.  There is no rectal pain.  He was evaluated by Dr. Linford Arnold.  His hemoglobin was normal.  His GI review of systems is otherwise  negative.   MEDICATIONS:  1. Adderall 15 mg daily.  2. Prozac 10 mg daily.  3. Protonix 40 mg daily.   ALLERGIES:  None known.   PAST MEDICAL HISTORY:  ADD, palpitations.   FAMILY HISTORY:  Heart disease in both parents, no colon cancer.   SOCIAL HISTORY:  Married, lives with his wife, no  children, no alcohol,  tobacco or drugs.   REVIEW OF SYSTEMS:  Negative otherwise.  See medical history form.   PHYSICAL EXAMINATION:  Well-nourished, well-developed young white man,  blood pressure 134/82, pulse 74.  EYES:  Anicteric.  EENT:  Normal mouth and oropharynx.  NECK:  Supple, without mass.  CHEST:  Clear.  HEART:  S1, S2, no murmurs, rubs or gallops.  ABDOMEN:  Soft, nontender, no organomegaly or mass.  Inspection of the  rectal area shows no problems.  EXTREMITIES:  No edema.  SKIN:  No rash.  NEURO:  Alert and oriented x3.   I have reviewed the office notes sent from Dr. Linford Arnold.  I appreciate  the opportunity to care for this patient.     Iva Boop, MD,FACG  Electronically Signed    CEG/MedQ  DD: 01/07/2007  DT: 01/07/2007  Job #: [id number]   cc:   Nani Gasser, M.D.

## 2011-06-23 ENCOUNTER — Other Ambulatory Visit: Payer: Self-pay | Admitting: Family Medicine

## 2011-06-23 NOTE — Telephone Encounter (Signed)
Pt called back and left message for the triage nurse that not to fill the prozac since he has weaned off that.  Only wants [place] to fill the other med requested. [name], LPN Domingo Dimes

## 2011-06-23 NOTE — Telephone Encounter (Signed)
Pt calling and said he was finished with the chantix, and his pharm had sent a request for the prozac which he was supposedly weaning off of when he finished the chantix. Plan:  [facility] for pt once again telling him we need to know if he needs to restart the prozac since he was to have weaned off prior to the recommendations of his office visit in May 2012. Pending pt callback. [name], LPN Domingo Dimes

## 2011-07-01 ENCOUNTER — Other Ambulatory Visit: Payer: Self-pay | Admitting: Family Medicine

## 2011-07-01 NOTE — Telephone Encounter (Signed)
Request for the trazadone is denied.  Prescription was sent on 06-23-11. [name], LPN Domingo Dimes

## 2011-07-06 ENCOUNTER — Other Ambulatory Visit: Payer: Self-pay | Admitting: Family Medicine

## 2011-07-06 NOTE — Telephone Encounter (Signed)
Pt called and was inquiring about his trazadone prescription.  Had called in 2 weeks ago to request. Plan:  Reviewed pt chart.  Script was sent electronically on 06-23-11.  Told pt I will call [facility] now and give a verbal sent the transmission of electronic script obviously didn't go through. [name], LPN Domingo Dimes

## 2011-09-20 ENCOUNTER — Encounter: Payer: Self-pay | Admitting: Family Medicine

## 2011-09-21 ENCOUNTER — Encounter: Payer: Self-pay | Admitting: Family Medicine

## 2011-09-21 ENCOUNTER — Ambulatory Visit (INDEPENDENT_AMBULATORY_CARE_PROVIDER_SITE_OTHER): Payer: PRIVATE HEALTH INSURANCE | Admitting: Family Medicine

## 2011-09-21 VITALS — BP 126/77 | HR 68 | Wt 176.0 lb

## 2011-09-21 DIAGNOSIS — F39 Unspecified mood [affective] disorder: Secondary | ICD-10-CM

## 2011-09-21 DIAGNOSIS — I10 Essential (primary) hypertension: Secondary | ICD-10-CM

## 2011-09-21 MED ORDER — LISINOPRIL 20 MG PO TABS: 20.0000 mg | ORAL_TABLET | Freq: Every day | ORAL | Status: DC

## 2011-09-21 MED ORDER — TRAZODONE HCL 50 MG PO TABS: 50.0000 mg | ORAL_TABLET | Freq: Every day | ORAL | Status: DC

## 2011-09-21 MED ORDER — ECONAZOLE NITRATE 1 % EX CREA: TOPICAL_CREAM | Freq: Two times a day (BID) | CUTANEOUS | Status: DC

## 2011-09-21 NOTE — Progress Notes (Signed)
  Subjective:    Patient ID: Robert Aguilar, male    DOB: Mar 19, 1971, 40 y.o.   MRN: [id number]  Hypertension This is a recurrent problem. The current episode started more than 1 month ago. The problem is controlled. There are no associated agents to hypertension. Risk factors for coronary artery disease include male gender. Past treatments include ACE inhibitors. There are no compliance problems.    Mood - Stable has been a little more irritable but doesn't want to restart the fluoxetine.  Sleeping better. Really hasn't use the trazodone much. Thought would like a refill just in case.   Review of Systems     Objective:   Physical Exam  Constitutional: He is oriented to person, place, and time. He appears well-developed and well-nourished.  HENT:  Head: Normocephalic and atraumatic.  Eyes: Conjunctivae are normal. Pupils are equal, round, and reactive to light.  Neck: Normal range of motion. Neck supple.  Cardiovascular: Normal rate, regular rhythm and normal heart sounds.   Pulmonary/Chest: Effort normal and breath sounds normal.  Neurological: He is alert and oriented to person, place, and time.  Skin: Skin is warm and dry.  Psychiatric: He has a normal mood and affect. His behavior is normal.          Assessment & Plan:  HTN - Doing well. RF sent. F/U in 6 months.  Make sure eating healthy and getting exercise Delcined flu vaccine.   Mood has been stable. He is actually sleeping better. I did refill his trazodone. He can call if he feels his mood is declining.  Declined the flu vaccine today.

## 2011-11-18 ENCOUNTER — Encounter: Payer: Self-pay | Admitting: *Deleted

## 2011-11-18 ENCOUNTER — Emergency Department
Admission: EM | Admit: 2011-11-18 | Discharge: 2011-11-18 | Disposition: A | Discharge status: Home / Self Care | Payer: PRIVATE HEALTH INSURANCE | Source: Home / Self Care | Attending: Emergency Medicine | Admitting: Emergency Medicine

## 2011-11-18 DIAGNOSIS — S61409A Unspecified open wound of unspecified hand, initial encounter: Secondary | ICD-10-CM

## 2011-11-18 DIAGNOSIS — S61401A Unspecified open wound of right hand, initial encounter: Secondary | ICD-10-CM

## 2011-11-18 HISTORY — DX: Essential (primary) hypertension: I10

## 2011-11-18 MED ORDER — TETANUS-DIPHTHERIA TOXOIDS TD 5-2 LFU IM INJ: 0.5000 mL | INJECTION | Freq: Once | INTRAMUSCULAR | Status: DC

## 2011-11-18 MED ORDER — TETANUS-DIPHTH-ACELL PERTUSSIS 5-2.5-18.5 LF-MCG/0.5 IM SUSP
0.5000 mL | Freq: Once | INTRAMUSCULAR | Status: AC
  Administered 2011-11-18: 0.5 mL via INTRAMUSCULAR

## 2011-11-18 MED ORDER — CEPHALEXIN 500 MG PO CAPS: 500.0000 mg | ORAL_CAPSULE | Freq: Three times a day (TID) | ORAL | Status: AC

## 2011-11-18 NOTE — ED Notes (Signed)
Pt states that he punctured his RT hand with a drill bit yesterday.

## 2011-11-18 NOTE — ED Provider Notes (Signed)
History     CSN: [id number] Arrival date & time: 11/18/2011  3:14 PM   First MD Initiated Contact with Patient 11/18/11 1530      Chief Complaint  Patient presents with  . 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 He is here today just to make sure that is okay and to see if he needs a tetanus update. Her last tetanus shot was in 2005. He does not notice any increased redness, drainage, or bleeding. He does have some mild soreness in the area especially while holding his drills today.  No other concerns today.  Past Medical History  Diagnosis Date  . Hypertension     Past Surgical History  Procedure Date  . Shoulder surgery     LT    Family History  Problem Relation Age of Onset  . Hypertension    . Hyperlipidemia    . Heart attack    . Colon cancer      grandmother, 2 aunts  . Coronary artery disease      family history  . Hypertension Mother   . Hyperlipidemia Mother   . Heart disease Mother     heart attack  . Heart failure Mother   . Hypertension Father   . Hyperlipidemia Father   . Heart disease Father     heart attack  . Heart failure Father   . Diabetes Brother   . Hypertension Brother     History  Substance Use Topics  . Smoking status: Former Games developer  . Smokeless tobacco: Not on file  . Alcohol Use: No      Review of Systems  Allergies  Nsaids  Home Medications   Current Outpatient Rx  Name Route Sig Dispense Refill  . CEPHALEXIN 500 MG PO CAPS Oral Take 1 capsule (500 mg total) by mouth 3 (three) times daily. 21 capsule 0  . ECONAZOLE NITRATE 1 % EX CREA Topical Apply topically 2 (two) times daily. X 3 weeks. 85 g 0  . LISINOPRIL 20 MG PO TABS Oral Take 1 tablet (20 mg total) by mouth daily. 90 tablet 1  . TRAZODONE HCL 50 MG PO TABS Oral Take 1 tablet (50 mg  total) by mouth at bedtime. 30 tablet 1    BP 139/83  Pulse 82  Temp(Src) 98.8 F (37.1 C) (Oral)  Resp 16  Ht 5\' 8"  (1.727 m)  Wt 178 lb 8 oz (80.967 kg)  BMI 27.14 kg/m2  SpO2 98%  Physical Exam  Nursing note and vitals reviewed. Constitutional: He is oriented to person, place, and time. He appears well-developed and well-nourished.  HENT:  Head: Normocephalic and atraumatic.  Neck: Neck supple.  Cardiovascular: Regular rhythm and normal heart sounds.   Pulmonary/Chest: Effort normal and breath sounds normal. No respiratory distress.  Musculoskeletal:       His right hand examination shows full range of motion of all digits. He is a very slight puncture wound in the thumb webspace. It does not look infected, no erythema, or any drainage. I do not see any foreign bodies or dura around the area. His distal neurovascular status is intact.  Neurological: He is alert and oriented to person, place, and time.  Skin: Skin is warm and dry.  Psychiatric: He has a normal mood and affect. His speech is normal.    ED Course  Procedures (including critical care time)  Labs Reviewed - No data to display No results found.   1. Open wound of right hand       MDM   We soaked his hand in Hibiclens solution. The area was examined and explored and I do not see any current signs of infection or foreign object. We did update his tetanus shot today. I am also going to give him a prescription for Keflex to use prophylactically over the next week. I have given him wound care instructions and precautions in case he is getting worse or is developing any type of infection. We covered with a small bandage and I told him that he will likely be sore for the next few days.  If further problems advised him to followup either with her primary care doctor or hand specialist.     Lily Kocher, MD 11/18/11 [phone]

## 2012-01-11 ENCOUNTER — Ambulatory Visit (INDEPENDENT_AMBULATORY_CARE_PROVIDER_SITE_OTHER): Payer: PRIVATE HEALTH INSURANCE | Admitting: Physician Assistant

## 2012-01-11 ENCOUNTER — Encounter: Payer: Self-pay | Admitting: Physician Assistant

## 2012-01-11 VITALS — BP 136/87 | HR 83 | Temp 98.3°F | Wt 178.0 lb

## 2012-01-11 DIAGNOSIS — J329 Chronic sinusitis, unspecified: Secondary | ICD-10-CM

## 2012-01-11 MED ORDER — AMOXICILLIN-POT CLAVULANATE 875-125 MG PO TABS: 1.0000 | ORAL_TABLET | Freq: Two times a day (BID) | ORAL | Status: AC

## 2012-01-11 NOTE — Patient Instructions (Signed)
Continue Nettie pot. Start Augmentin for 10days.   Sinusitis Sinuses are air pockets within the bones of your face. The growth of bacteria within a sinus leads to infection. The infection prevents the sinuses from draining. This infection is called sinusitis. SYMPTOMS  There will be different areas of pain depending on which sinuses have become infected.  The maxillary sinuses often produce pain beneath the eyes.   Frontal sinusitis may cause pain in the middle of the forehead and above the eyes.  Other problems (symptoms) include:  Toothaches.   Colored, pus-like (purulent) drainage from the nose.   Swelling, warmth, and tenderness over the sinus areas may be signs of infection.  TREATMENT  Sinusitis is most often determined by an exam.X-rays may be taken. If x-rays have been taken, make sure you obtain your results or find out how you are to obtain them. Your caregiver may give you medications (antibiotics). These are medications that will help kill the bacteria causing the infection. You may also be given a medication (decongestant) that helps to reduce sinus swelling.  HOME CARE INSTRUCTIONS   Only take over-the-counter or prescription medicines for pain, discomfort, or fever as directed by your caregiver.   Drink extra fluids. Fluids help thin the mucus so your sinuses can drain more easily.   Applying either moist heat or ice packs to the sinus areas may help relieve discomfort.   Use saline nasal sprays to help moisten your sinuses. The sprays can be found at your local drugstore.  SEEK IMMEDIATE MEDICAL CARE IF:  You have a fever.   You have increasing pain, severe headaches, or toothache.   You have nausea, vomiting, or drowsiness.   You develop unusual swelling around the face or trouble seeing.  MAKE SURE YOU:   Understand these instructions.   Will watch your condition.   Will get help right away if you are not doing well or get worse.  Document Released:  11/30/2005 Document Revised: 08/12/2011 Document Reviewed: 06/29/2007 [facility] [place] Patient Information 2012 [place], [place].

## 2012-01-11 NOTE — Progress Notes (Signed)
  Subjective:    Patient ID: Robert Aguilar, male    DOB: Sep 20, 1971, 41 y.o.   MRN: [id number]  Sinusitis This is a new problem. The current episode started 1 to 4 weeks ago. His pain is at a severity of 4/10. The pain is mild. Associated symptoms include congestion, coughing, headaches, sinus pressure and swollen glands. Pertinent negatives include no chills, ear pain, shortness of breath or sore throat. Past treatments include acetaminophen (Nettie pot.). The treatment provided mild relief.      Review of Systems  Constitutional: Negative for chills.  HENT: Positive for congestion and sinus pressure. Negative for ear pain and sore throat.   Respiratory: Positive for cough. Negative for shortness of breath.   Neurological: Positive for headaches.       Objective:   Physical Exam  Constitutional: He is oriented to person, place, and time. He appears well-developed and well-nourished.  HENT:  Head: Normocephalic and atraumatic.  Right Ear: External ear normal.  Left Ear: External ear normal.  Nose: Nose normal.  Mouth/Throat: Oropharynx is clear and moist. No oropharyngeal exudate.       Bilateral maxillary tenderness.  Eyes: Conjunctivae are normal. Right eye exhibits no discharge. Left eye exhibits no discharge.  Neck: Normal range of motion. Neck supple.  Cardiovascular: Normal rate, regular rhythm and normal heart sounds.   Pulmonary/Chest: Effort normal.  Neurological: He is alert and oriented to person, place, and time.  Skin: Skin is warm and dry.  Psychiatric: He has a normal mood and affect. His behavior is normal.          Assessment & Plan:  Sinus infection- Augmentin for 10 days. Continue with symptomatic care. Call office if not improving in 48 to 72 hours.

## 2012-01-18 ENCOUNTER — Telehealth: Payer: Self-pay | Admitting: *Deleted

## 2012-01-18 MED ORDER — FLUTICASONE PROPIONATE 50 MCG/ACT NA SUSP: 2.0000 | Freq: Every day | NASAL | Status: DC

## 2012-01-18 NOTE — Telephone Encounter (Signed)
Pt informed

## 2012-01-18 NOTE — Telephone Encounter (Signed)
Pt states that he has had diarrhea x 4 days and would like to know if you can send him a nasal spray to pharmacy. Please advise.

## 2012-01-18 NOTE — Telephone Encounter (Signed)
Sent Flonase to pharmacy. Diarrhea and upset stomach could be from the antibiotic. Make sure he is taking with food and stay hydrated.

## 2012-03-30 ENCOUNTER — Encounter: Payer: Self-pay | Admitting: Family Medicine

## 2012-03-30 ENCOUNTER — Ambulatory Visit (INDEPENDENT_AMBULATORY_CARE_PROVIDER_SITE_OTHER): Payer: PRIVATE HEALTH INSURANCE | Admitting: Family Medicine

## 2012-03-30 ENCOUNTER — Ambulatory Visit
Admission: RE | Admit: 2012-03-30 | Discharge: 2012-03-30 | Disposition: A | Discharge status: Home / Self Care | Payer: PRIVATE HEALTH INSURANCE | Source: Ambulatory Visit | Attending: Family Medicine | Admitting: Family Medicine

## 2012-03-30 VITALS — BP 97/59 | HR 76 | Ht 67.5 in | Wt 173.0 lb

## 2012-03-30 DIAGNOSIS — M79609 Pain in unspecified limb: Secondary | ICD-10-CM

## 2012-03-30 DIAGNOSIS — M79646 Pain in unspecified finger(s): Secondary | ICD-10-CM

## 2012-03-30 DIAGNOSIS — E785 Hyperlipidemia, unspecified: Secondary | ICD-10-CM

## 2012-03-30 DIAGNOSIS — S6710XA Crushing injury of unspecified finger(s), initial encounter: Secondary | ICD-10-CM

## 2012-03-30 DIAGNOSIS — I10 Essential (primary) hypertension: Secondary | ICD-10-CM

## 2012-03-30 MED ORDER — LISINOPRIL 10 MG PO TABS: 10.0000 mg | ORAL_TABLET | Freq: Every day | ORAL | Status: DC

## 2012-03-30 NOTE — Patient Instructions (Signed)
We will call you with your lab results and xray. If you don't here from [place] in about a week then please give [place] a call at [phone].

## 2012-03-30 NOTE — Progress Notes (Addendum)
  Subjective:    Patient ID: Robert Aguilar, male    DOB: 17-Sep-1971, 41 y.o.   MRN: [id number]  Hypertension This is a chronic problem. The current episode started more than 1 year ago. The problem is unchanged. The problem is controlled. Pertinent negatives include no blurred vision, chest pain, palpitations, peripheral edema or shortness of breath. There are no associated agents to hypertension. The current treatment provides moderate improvement. There are no compliance problems.    He has right index finger with a hammer on Monday, 3 days ago. He says it initially was very swollen and dusky purple. It is a little less swollen today. Initially he was unable to flex it but can flex it some today, but not completely. It is still very tender. No drainage or fever.   Review of Systems  Eyes: Negative for blurred vision.  Respiratory: Negative for shortness of breath.   Cardiovascular: Negative for chest pain and palpitations.       Objective:   Physical Exam  Constitutional: He is oriented to person, place, and time. He appears well-developed and well-nourished.  HENT:  Head: Normocephalic and atraumatic.  Cardiovascular: Normal rate, regular rhythm and normal heart sounds.   Pulmonary/Chest: Effort normal and breath sounds normal.  Neurological: He is alert and oriented to person, place, and time.  Skin: Skin is warm and dry.       There is a small laceration near the nail on his right index finger. Some trace swelling and tenderness around the DIP joint. He is mildly tender over the joint.  Psychiatric: He has a normal mood and affect. His behavior is normal.          Assessment & Plan:  Hypertension- controlled. He is doing very well. He's been doing Weight Watchers with his wife and has lost some weight. His blood pressures actually little low today. I will cut his lisinopril in half to 10 mg daily. Followup in 6 months. He can follow his blood pressure home and if it stays low  then consider stopping the medication and see how he does without it.  Hammer injury to right index finger-will get x-ray today to make sure he has not fractured it since he did have very significant swelling initially. He is still tender today but it could just be soft tissue injury.Can ice as needed an duse NSAID prn.   Hyperlipidemia - LDL not at goal. Due to recheck Lipid panel ordered today.

## 2012-04-05 DIAGNOSIS — E785 Hyperlipidemia, unspecified: Secondary | ICD-10-CM | POA: Insufficient documentation

## 2012-04-05 NOTE — Progress Notes (Signed)
Addended by: Nani Gasser D on: 04/05/2012 08:16 PM   Modules accepted: Level of Service

## 2012-04-13 ENCOUNTER — Other Ambulatory Visit: Payer: Self-pay | Admitting: Family Medicine

## 2012-12-23 ENCOUNTER — Ambulatory Visit (INDEPENDENT_AMBULATORY_CARE_PROVIDER_SITE_OTHER): Payer: PRIVATE HEALTH INSURANCE | Admitting: Family Medicine

## 2012-12-23 ENCOUNTER — Encounter: Payer: Self-pay | Admitting: Family Medicine

## 2012-12-23 VITALS — BP 106/73 | HR 66 | Resp 17 | Wt 165.0 lb

## 2012-12-23 DIAGNOSIS — I10 Essential (primary) hypertension: Secondary | ICD-10-CM

## 2012-12-23 DIAGNOSIS — Z23 Encounter for immunization: Secondary | ICD-10-CM

## 2012-12-23 DIAGNOSIS — K589 Irritable bowel syndrome without diarrhea: Secondary | ICD-10-CM

## 2012-12-23 NOTE — Progress Notes (Signed)
  Subjective:    Patient ID: Robert Aguilar, male    DOB: 1971-09-09, 42 y.o.   MRN: [id number]  HPI HTN -  Pt denies chest pain, SOB, dizziness, or heart palpitations.  Taking meds as directed w/o problems.  Denies medication side effects.    IBS- Doing well with his recent dietary changes   Has been sweating more than usual.  Mom and sister haver thyroid d/o.  Review of Systems     Objective:   Physical Exam  Constitutional: He is oriented to person, place, and time. He appears well-developed and well-nourished.  HENT:  Head: Normocephalic and atraumatic.  Cardiovascular: Normal rate, regular rhythm and normal heart sounds.   Pulmonary/Chest: Effort normal and breath sounds normal.  Neurological: He is alert and oriented to person, place, and time.  Skin: Skin is warm and dry.  Psychiatric: He has a normal mood and affect. His behavior is normal.          Assessment & Plan:  HTN - Well controlled.  Dicussed stopping BP and see what happens. Continue to work on diet and exercise.  He monitors his blood pressure very carefully at home. If he starts to notice the pressures creeping up over 130 then I recommend he call the office and we can refill his lisinopril at 5 mg which is half the dose is taking currently. Overall though I think he'll do well if he continues to maintain a healthy weight and continues to eat healthy and exercise. Followup in 6 months.  IBS - well controlled right now with hid diet changes  Flu vaccine given today.

## 2013-01-09 LAB — COMPLETE METABOLIC PANEL WITH GFR
Albumin: 4.4 g/dL (ref 3.5–5.2)
Alkaline Phosphatase: 62 U/L (ref 39–117)
BUN: 14 mg/dL (ref 6–23)
Calcium: 9.2 mg/dL (ref 8.4–10.5)
Chloride: 104 mEq/L (ref 96–112)
GFR, Est African American: >89 mL/min
GFR, Est Non African American: >89 mL/min
Glucose, Bld: 98 mg/dL (ref 70–99)
Potassium: 4.2 mEq/L (ref 3.5–5.3)

## 2013-01-09 LAB — LIPID PANEL
HDL: 48 mg/dL (ref >39)
LDL Cholesterol: 139 mg/dL — ABNORMAL HIGH (ref 0–99)
Total CHOL/HDL Ratio: 4.4 Ratio

## 2014-01-19 ENCOUNTER — Ambulatory Visit (INDEPENDENT_AMBULATORY_CARE_PROVIDER_SITE_OTHER): Payer: BC Managed Care – PPO | Admitting: Sports Medicine

## 2014-01-19 ENCOUNTER — Encounter: Payer: Self-pay | Admitting: Sports Medicine

## 2014-01-19 VITALS — BP 143/89 | HR 80 | Ht 68.0 in | Wt 177.0 lb

## 2014-01-19 DIAGNOSIS — I10 Essential (primary) hypertension: Secondary | ICD-10-CM

## 2014-01-19 MED ORDER — LISINOPRIL 5 MG PO TABS: 5.0000 mg | ORAL_TABLET | Freq: Every day | ORAL | Status: DC

## 2014-01-19 NOTE — Assessment & Plan Note (Signed)
Has been off of blood pressure medicine for some time now, blood pressure was 180 this morning, currently 140s. He did have some low blood pressures on lisinopril 10, starting at 5 mg daily. Checking routine blood work so that he will be teed up by the time he comes back to see Dr. Linford ArnoldMetheney.

## 2014-01-19 NOTE — Progress Notes (Signed)
  Subjective:    CC: Followup  HPI: Hypertension: Robert Aguilar was on 10 mg of lisinopril a year ago, he started to have some relatively low blood pressures and this was stopped, more recently he started to have some throbbing headaches, without visual changes or chest pain. Blood pressures at home are ranging in the 180s systolic. He comes back to see us expecting to likely have a go back on blood pressure medication.  Past medical history, Surgical history, Family history not pertinant except as noted below, Social history, Allergies, and medications have been entered into the medical record, reviewed, and no changes needed.   Review of Systems: No fevers, chills, night sweats, weight loss, chest pain, or shortness of breath.   Objective:    General: Well Developed, well nourished, and in no acute distress.  Neuro: Alert and oriented x3, extra-ocular muscles intact, sensation grossly intact.  HEENT: Normocephalic, atraumatic, pupils equal round reactive to light, neck supple, no masses, no lymphadenopathy, thyroid nonpalpable.  Skin: Warm and dry, no rashes. Cardiac: Regular rate and rhythm, no murmurs rubs or gallops, no lower extremity edema.  Respiratory: Clear to auscultation bilaterally. Not using accessory muscles, speaking in full sentences.  Impression and Recommendations:

## 2014-02-02 LAB — COMPREHENSIVE METABOLIC PANEL
ALT: 48 U/L (ref 0–53)
AST: 31 U/L (ref 0–37)
Albumin: 4.6 g/dL (ref 3.5–5.2)
Calcium: 9.5 mg/dL (ref 8.4–10.5)
Chloride: 101 mEq/L (ref 96–112)
Potassium: 4.6 mEq/L (ref 3.5–5.3)

## 2014-02-02 LAB — COMPREHENSIVE METABOLIC PANEL WITH GFR
Alkaline Phosphatase: 76 U/L (ref 39–117)
BUN: 22 mg/dL (ref 6–23)
CO2: 30 meq/L (ref 19–32)
Creat: 0.78 mg/dL (ref 0.50–1.35)
Glucose, Bld: 102 mg/dL — ABNORMAL HIGH (ref 70–99)
Sodium: 141 meq/L (ref 135–145)
Total Bilirubin: 0.3 mg/dL (ref 0.2–1.2)
Total Protein: 7 g/dL (ref 6.0–8.3)

## 2014-02-02 LAB — CBC
HCT: 43.4 % (ref 39.0–52.0)
Hemoglobin: 15 g/dL (ref 13.0–17.0)
MCH: 31.1 pg (ref 26.0–34.0)
MCHC: 34.6 g/dL (ref 30.0–36.0)
MCV: 89.9 fL (ref 78.0–100.0)
Platelets: 216 10*3/uL (ref 150–400)
RBC: 4.83 MIL/uL (ref 4.22–5.81)
RDW: 13.5 % (ref 11.5–15.5)
WBC: 5 10*3/uL (ref 4.0–10.5)

## 2014-02-02 LAB — HEMOGLOBIN A1C
Hgb A1c MFr Bld: 5.5 % (ref <5.7)
Mean Plasma Glucose: 111 mg/dL (ref <117)

## 2014-02-02 LAB — LIPID PANEL
Cholesterol: 163 mg/dL (ref 0–200)
HDL: 36 mg/dL — ABNORMAL LOW (ref >39)
LDL Cholesterol: 108 mg/dL — ABNORMAL HIGH (ref 0–99)
Total CHOL/HDL Ratio: 4.5 Ratio
Triglycerides: 96 mg/dL (ref <150)
VLDL: 19 mg/dL (ref 0–40)

## 2014-02-02 LAB — TSH: TSH: 2.192 u[IU]/mL (ref 0.350–4.500)

## 2014-04-03 ENCOUNTER — Ambulatory Visit (INDEPENDENT_AMBULATORY_CARE_PROVIDER_SITE_OTHER): Payer: BC Managed Care – PPO | Admitting: Family Medicine

## 2014-04-03 ENCOUNTER — Encounter: Payer: Self-pay | Admitting: Family Medicine

## 2014-04-03 VITALS — BP 149/92 | HR 74 | Ht 68.0 in | Wt 174.0 lb

## 2014-04-03 DIAGNOSIS — I10 Essential (primary) hypertension: Secondary | ICD-10-CM

## 2014-04-03 DIAGNOSIS — R937 Abnormal findings on diagnostic imaging of other parts of musculoskeletal system: Secondary | ICD-10-CM

## 2014-04-03 DIAGNOSIS — Z8249 Family history of ischemic heart disease and other diseases of the circulatory system: Secondary | ICD-10-CM

## 2014-04-03 DIAGNOSIS — R7301 Impaired fasting glucose: Secondary | ICD-10-CM

## 2014-04-03 DIAGNOSIS — R948 Abnormal results of function studies of other organs and systems: Secondary | ICD-10-CM

## 2014-04-03 LAB — POCT GLYCOSYLATED HEMOGLOBIN (HGB A1C): Hemoglobin A1C: 5.5

## 2014-04-03 MED ORDER — LISINOPRIL 10 MG PO TABS: 10.0000 mg | ORAL_TABLET | Freq: Every day | ORAL | Status: DC

## 2014-04-03 NOTE — Progress Notes (Signed)
Subjective:    Patient ID: Robert Aguilar, male    DOB: 09-26-1971, 43 y.o.   MRN: [id number]  HPI Hypertension- Pt denies chest pain, SOB, dizziness, or heart palpitations.  Taking meds as directed w/o problems.  Denies medication side effects.  Has been more tired with exercise.  Now has strong family history of heart  Disease. His mother passed away from acute heart attack at age 554 in January of this year. He has not any chest pain itself.  He did have an abnormal glucose on his blood work. He also did help screening per his daughter's school and also had an abnormal glucose on that time as well. He was also told he had an abnormal bone density test which was done through [facility]. He was taking 2-3 sodas a day at that time and was told to stop. He really doesn't drink much milk or dairy products. He has started exercising recently.  Review of Systems  BP 149/92  Pulse 74  Ht 5\' 8"  (1.727 m)  Wt 174 lb (78.926 kg)  BMI 26.46 kg/m2  SpO2 98%    Allergies  Allergen Reactions  . Nsaids     REACTION: Blook in stool when takes for more than 2 days.    Past Medical History  Diagnosis Date  . Hypertension     Past Surgical History  Procedure Laterality Date  . Shoulder surgery      LT    History   Social History  . Marital Status: Married    Spouse Name: N/A    Number of Children: N/A  . Years of Education: N/A   Occupational History  . Not on file.   Social History Main Topics  . Smoking status: Former Games developermoker  . Smokeless tobacco: Not on file  . Alcohol Use: No  . Drug Use: No  . 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   Other Topics Concern  . Not on file   Social History Narrative  . No narrative on file    Family History  Problem Relation Age of Onset  . Hypertension    . Hyperlipidemia    . Heart attack Brother 54  .  Colon cancer      grandmother, 2 aunts  . Coronary artery disease      family history  . Hypertension Mother   . Hyperlipidemia Mother   . Heart disease Mother     heart attack  . Heart failure Mother   . Hypertension Father   . Hyperlipidemia Father   . Heart disease Father     heart attack  . Heart failure Father   . Diabetes Brother   . Hypertension Brother   . Thyroid disease Mother     Outpatient Encounter Prescriptions as of 04/03/2014  Medication Sig  . lisinopril (PRINIVIL,ZESTRIL) 10 MG tablet Take 1 tablet (10 mg total) by mouth daily.  . [DISCONTINUED] lisinopril (PRINIVIL,ZESTRIL) 5 MG tablet Take 1 tablet (5 mg total) by mouth daily.          Objective:   Physical Exam  Constitutional: He is oriented to person, place, and time. He appears well-developed and well-nourished.  HENT:  Head: Normocephalic and atraumatic.  Cardiovascular: Normal rate, regular rhythm and normal heart sounds.   Pulmonary/Chest: Effort normal and breath sounds normal.  Neurological: He is  alert and oriented to person, place, and time.  Skin: Skin is warm and dry.  Psychiatric: He has a normal mood and affect. His behavior is normal.          Assessment & Plan:  HTN - improved but not well controlled. Increase lisinopril to 10 mg. We'll see how he tolerates this in the next 2-3 months. If she's not feeling well or has any palms and please let us know. He has been a little bit more tired lately but is not sure if it is technically related to the medication or not. Also with his family history of her disease we discussed the importance of regular exercise, controlling blood pressure and cholesterol, and coming in sooner rather than later if experiencing any chest pain or other symptoms.  Abnormal glucose-we'll do hemoglobin A1c today for further evaluation. His glucose was 102 on recent lab work done in February. A1c is 5.5 which is completely normal. Reassured patient.  Abnormal  bone density screen-discussed avoiding soda. Getting regular exercise. Make sure he is getting adequate calcium and vitamin D intake. In considering a screening of vitamin D deficiency.

## 2014-04-04 LAB — VITAMIN D 25 HYDROXY (VIT D DEFICIENCY, FRACTURES): Vit D, 25-Hydroxy: 47 ng/mL (ref 30–89)

## 2014-07-17 ENCOUNTER — Other Ambulatory Visit: Payer: Self-pay | Admitting: *Deleted

## 2014-07-17 DIAGNOSIS — I10 Essential (primary) hypertension: Secondary | ICD-10-CM

## 2014-07-17 MED ORDER — LISINOPRIL 10 MG PO TABS: 10.0000 mg | ORAL_TABLET | Freq: Every day | ORAL | Status: DC

## 2014-08-01 ENCOUNTER — Encounter: Payer: Self-pay | Admitting: Family Medicine

## 2014-08-01 ENCOUNTER — Ambulatory Visit (INDEPENDENT_AMBULATORY_CARE_PROVIDER_SITE_OTHER): Payer: BC Managed Care – PPO | Admitting: Family Medicine

## 2014-08-01 VITALS — BP 106/61 | HR 82 | Ht 68.0 in | Wt 176.0 lb

## 2014-08-01 DIAGNOSIS — I10 Essential (primary) hypertension: Secondary | ICD-10-CM | POA: Diagnosis not present

## 2014-08-01 NOTE — Assessment & Plan Note (Signed)
Well-controlled. Continue current regimen. Follow up in 6 months. He will be due for CMP and fasting lipid panel at that time and reminded him to come in fasting.

## 2014-08-01 NOTE — Progress Notes (Signed)
   Subjective:    Patient ID: Robert Aguilar, male    DOB: 1971-01-04, 43 y.o.   MRN: [id number]  Hypertension   Hypertension- Pt denies chest pain, SOB, dizziness, or heart palpitations.  Taking meds as directed w/o problems.  Denies medication side effects.  We increased his lisinopril to 10 mg 4 months ago.  Review of Systems     Objective:   Physical Exam  Constitutional: He is oriented to person, place, and time. He appears well-developed and well-nourished.  HENT:  Head: Normocephalic and atraumatic.  Cardiovascular: Normal rate, regular rhythm and normal heart sounds.   Pulmonary/Chest: Effort normal and breath sounds normal.  Neurological: He is alert and oriented to person, place, and time.  Skin: Skin is warm and dry.  Psychiatric: He has a normal mood and affect. His behavior is normal.          Assessment & Plan:

## 2014-10-30 ENCOUNTER — Other Ambulatory Visit: Payer: Self-pay | Admitting: Family Medicine

## 2014-11-16 ENCOUNTER — Encounter: Payer: Self-pay | Admitting: Family Medicine

## 2014-11-16 ENCOUNTER — Ambulatory Visit (INDEPENDENT_AMBULATORY_CARE_PROVIDER_SITE_OTHER): Payer: BC Managed Care – PPO | Admitting: Family Medicine

## 2014-11-16 VITALS — BP 140/88 | HR 81 | Wt 181.0 lb

## 2014-11-16 DIAGNOSIS — J0140 Acute pansinusitis, unspecified: Secondary | ICD-10-CM | POA: Diagnosis not present

## 2014-11-16 MED ORDER — AMOXICILLIN-POT CLAVULANATE 875-125 MG PO TABS: 1.0000 | ORAL_TABLET | Freq: Two times a day (BID) | ORAL | Status: DC

## 2014-11-16 NOTE — Patient Instructions (Signed)

## 2014-11-16 NOTE — Progress Notes (Signed)
   Subjective:    Patient ID: Robert Aguilar, male    DOB: [birth year], 43 y.o.   MRN: [id number]  HPI ST, cough with green sputum. + chills. No fever. + bilat facial pressure. Sxs x 2 weeks. NO GI sxs. Says started to getting a little better of 2 days and then got worse. Not really using any over-the-counter cough or cold medications as they make him feel bad. Review of Systems     Objective:   Physical Exam  Constitutional: He is oriented to person, place, and time. He appears well-developed and well-nourished.  HENT:  Head: Normocephalic and atraumatic.  Right Ear: External ear normal.  Left Ear: External ear normal.  Nose: Nose normal.  Mouth/Throat: Oropharynx is clear and moist.  TMs and canals are clear.   Eyes: Conjunctivae and EOM are normal. Pupils are equal, round, and reactive to light.  Neck: Neck supple. No thyromegaly present.  Cardiovascular: Normal rate and normal heart sounds.   Pulmonary/Chest: Effort normal and breath sounds normal.  Lymphadenopathy:    He has no cervical adenopathy.  Neurological: He is alert and oriented to person, place, and time.  Skin: Skin is warm and dry.  Psychiatric: He has a normal mood and affect.          Assessment & Plan:  Acute Sinusitis - will tx with Augmentin.  Call if not better in one week.  Recommend running a humidifier.

## 2015-02-11 ENCOUNTER — Other Ambulatory Visit: Payer: Self-pay | Admitting: Family Medicine

## 2015-02-20 ENCOUNTER — Telehealth: Payer: Self-pay | Admitting: *Deleted

## 2015-02-20 DIAGNOSIS — Z114 Encounter for screening for human immunodeficiency virus [HIV]: Secondary | ICD-10-CM

## 2015-02-20 DIAGNOSIS — I10 Essential (primary) hypertension: Secondary | ICD-10-CM

## 2015-02-20 DIAGNOSIS — E785 Hyperlipidemia, unspecified: Secondary | ICD-10-CM

## 2015-02-20 DIAGNOSIS — Z0000 Encounter for general adult medical examination without abnormal findings: Secondary | ICD-10-CM

## 2015-02-20 NOTE — Telephone Encounter (Signed)
Labs ordered .Robert Aguilar  

## 2015-02-28 ENCOUNTER — Other Ambulatory Visit: Payer: Self-pay | Admitting: *Deleted

## 2015-02-28 NOTE — Telephone Encounter (Signed)
Pt called and asked that additional labs be added on to what was ordered. He would like tsh,b-12, and vit d.  Additional dx codes R53.81, M85.80. Spoke w/angela at [facility] to have these added on.Robert Aguilar. Robert Aguilar.Loralee PacasBarkley, Janiesha Diehl GerberLynetta

## 2015-03-01 ENCOUNTER — Telehealth: Payer: Self-pay | Admitting: *Deleted

## 2015-03-01 LAB — LIPID PANEL
Cholesterol: 207 mg/dL — ABNORMAL HIGH (ref 0–200)
HDL: 41 mg/dL (ref >=40)
LDL CALC: 136 mg/dL — AB (ref 0–99)
Total CHOL/HDL Ratio: 5 Ratio
Triglycerides: 149 mg/dL (ref <150)
VLDL: 30 mg/dL (ref 0–40)

## 2015-03-01 LAB — COMPLETE METABOLIC PANEL WITH GFR
ALK PHOS: 69 U/L (ref 39–117)
ALT: 26 U/L (ref 0–53)
AST: 24 U/L (ref 0–37)
Albumin: 4.2 g/dL (ref 3.5–5.2)
BUN: 16 mg/dL (ref 6–23)
CO2: 28 meq/L (ref 19–32)
Calcium: 9.1 mg/dL (ref 8.4–10.5)
Chloride: 102 mEq/L (ref 96–112)
Creat: 0.78 mg/dL (ref 0.50–1.35)
GFR, Est African American: >89 mL/min
GFR, Est Non African American: >89 mL/min
Glucose, Bld: 94 mg/dL (ref 70–99)
Potassium: 4.7 mEq/L (ref 3.5–5.3)
SODIUM: 140 meq/L (ref 135–145)
TOTAL PROTEIN: 7 g/dL (ref 6.0–8.3)
Total Bilirubin: 0.5 mg/dL (ref 0.2–1.2)

## 2015-03-01 LAB — VITAMIN B12: Vitamin B-12: 410 pg/mL (ref 211–911)

## 2015-03-01 LAB — HIV ANTIBODY (ROUTINE TESTING W REFLEX): HIV 1&2 Ab, 4th Generation: NONREACTIVE

## 2015-03-01 LAB — TSH: TSH: 2.616 u[IU]/mL (ref 0.350–4.500)

## 2015-03-01 LAB — VITAMIN D 25 HYDROXY (VIT D DEFICIENCY, FRACTURES): Vit D, 25-Hydroxy: 31 ng/mL (ref 30–100)

## 2015-03-01 NOTE — Telephone Encounter (Signed)
Called patient & gave results.. Direce

## 2015-03-01 NOTE — Telephone Encounter (Signed)
-----   Message from Agapito Gamesatherine D Metheney, MD sent at 03/01/2015  7:56 AM EDT ----- Call pt: cholesterol and LDL are high cmpared to last year. maks sure working on low fat diet and exercise. Neg for HIV.  CMP is ok. Thyroid and B12 are nromal. Vit D is down some. Make sure taking 800IU daily esp in the winter months.

## 2015-03-07 ENCOUNTER — Ambulatory Visit (INDEPENDENT_AMBULATORY_CARE_PROVIDER_SITE_OTHER): Payer: BLUE CROSS/BLUE SHIELD | Admitting: Family Medicine

## 2015-03-07 ENCOUNTER — Encounter: Payer: Self-pay | Admitting: Family Medicine

## 2015-03-07 VITALS — BP 147/87 | HR 74 | Temp 98.0°F | Ht 68.0 in | Wt 177.0 lb

## 2015-03-07 DIAGNOSIS — Z0189 Encounter for other specified special examinations: Secondary | ICD-10-CM | POA: Diagnosis not present

## 2015-03-07 DIAGNOSIS — I10 Essential (primary) hypertension: Secondary | ICD-10-CM | POA: Diagnosis not present

## 2015-03-07 DIAGNOSIS — Z0000 Encounter for general adult medical examination without abnormal findings: Secondary | ICD-10-CM

## 2015-03-07 MED ORDER — DICYCLOMINE HCL 20 MG PO TABS: 20.0000 mg | ORAL_TABLET | Freq: Four times a day (QID) | ORAL | Status: DC

## 2015-03-07 MED ORDER — LISINOPRIL 10 MG PO TABS: 10.0000 mg | ORAL_TABLET | Freq: Every day | ORAL | Status: DC

## 2015-03-07 NOTE — Progress Notes (Signed)
   Subjective:    Patient ID: Robert Aguilar, male    DOB: 1971-07-20, 44 y.o.   MRN: [id number]  HPI Here for CPE -  Getting some exercise. Had labs done.    Hypertension- Pt denies chest pain, SOB, dizziness, or heart palpitations.  Taking meds as directed w/o problems.  Denies medication side effects.  Note, today he took his blood pressure pill about 30 minutes before he got here. He says it's normally well controlled at home.    Review of Systems Comprehensive ROS is neg.     Objective:   Physical Exam  Constitutional: He is oriented to person, place, and time. He appears well-developed and well-nourished.  HENT:  Head: Normocephalic and atraumatic.  Right Ear: External ear normal.  Left Ear: External ear normal.  Nose: Nose normal.  Mouth/Throat: Oropharynx is clear and moist.  Eyes: Conjunctivae and EOM are normal. Pupils are equal, round, and reactive to light.  Neck: Normal range of motion. Neck supple. No thyromegaly present.  Cardiovascular: Normal rate, regular rhythm, normal heart sounds and intact distal pulses.   Pulmonary/Chest: Effort normal and breath sounds normal.  Abdominal: Soft. Bowel sounds are normal. He exhibits no distension and no mass. There is no tenderness. There is no rebound and no guarding.  Musculoskeletal: Normal range of motion.  Lymphadenopathy:    He has no cervical adenopathy.  Neurological: He is alert and oriented to person, place, and time. He has normal reflexes.  Skin: Skin is warm and dry.  Psychiatric: He has a normal mood and affect. His behavior is normal. Judgment and thought content normal.          Assessment & Plan:  CPE -  Keep up a regular exercise program and make sure you are eating a healthy diet Try to eat 4 servings of dairy a day, or if you are lactose intolerant take a calcium with vitamin D daily.  Your vaccines are up to date.  Reviewed labwork.   HTN - Is is mildly elevated.  He reports his home blood  pressures have been well controlled. Encouraged him to keep an eye on it. If it stays elevated then please let me know. Otherwise I'll see him back in 6 months.

## 2015-03-07 NOTE — Patient Instructions (Signed)
Keep up a regular exercise program and make sure you are eating a healthy diet Try to eat 4 servings of dairy a day, or if you are lactose intolerant take a calcium with vitamin D daily.  Your vaccines are up to date.   

## 2015-04-17 ENCOUNTER — Encounter: Payer: Self-pay | Admitting: Family Medicine

## 2015-04-17 ENCOUNTER — Ambulatory Visit (INDEPENDENT_AMBULATORY_CARE_PROVIDER_SITE_OTHER): Payer: BLUE CROSS/BLUE SHIELD | Admitting: Family Medicine

## 2015-04-17 VITALS — BP 139/87 | HR 86 | Temp 97.9°F | Wt 180.0 lb

## 2015-04-17 DIAGNOSIS — A499 Bacterial infection, unspecified: Secondary | ICD-10-CM

## 2015-04-17 DIAGNOSIS — J329 Chronic sinusitis, unspecified: Secondary | ICD-10-CM

## 2015-04-17 DIAGNOSIS — B9689 Other specified bacterial agents as the cause of diseases classified elsewhere: Secondary | ICD-10-CM

## 2015-04-17 MED ORDER — DOXYCYCLINE HYCLATE 100 MG PO TABS: 100.0000 mg | ORAL_TABLET | Freq: Two times a day (BID) | ORAL | Status: DC

## 2015-04-17 NOTE — Progress Notes (Signed)
CC: Bing NeighborsScott W Broadus JohnWarren is a 44 y.o. male is here for Sinusitis   Subjective: HPI:  Facial pressure in the cheeks and forehead that have been present for the last 10 days. Came on abruptly and has not been getting better or worse since onset despite using allergy medication and ibuprofen. Symptoms are moderate in severity present all hours of the day. Accompanied by thick green nasal discharge and crackling in the right ear. Symptoms are present all hours of the day. Not interfere with sleep. Denies fevers, chills, cough, wheezing, chest pain, headache, confusion or rash. Denies sore throat   Review Of Systems Outlined In HPI  Past Medical History  Diagnosis Date  . Hypertension     Past Surgical History  Procedure Laterality Date  . Shoulder surgery      LT   Family History  Problem Relation Age of Onset  . Hypertension    . Hyperlipidemia    . Heart attack Brother 54  . Colon cancer      grandmother, 2 aunts  . Coronary artery disease      family history  . Hypertension Mother   . Hyperlipidemia Mother   . Heart disease Mother     heart attack  . Heart failure Mother   . Hypertension Father   . Hyperlipidemia Father   . Heart disease Father     heart attack  . Heart failure Father   . Diabetes Brother   . Hypertension Brother   . Thyroid disease Mother     History   Social History  . Marital Status: Married    Spouse Name: N/A  . Number of Children: N/A  . Years of Education: N/A   Occupational History  . Not on file.   Social History Main Topics  . Smoking status: Former Games developermoker  . Smokeless tobacco: Not on file  . Alcohol Use: No  . Drug Use: No  . 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   Other Topics Concern  . Not on file   Social History Narrative     Objective: BP 139/87 mmHg  Pulse 86  Temp(Src) 97.9 F (36.6 C) (Oral)  Wt 180 lb (81.647  kg)  General: Alert and Oriented, No Acute Distress HEENT: Pupils equal, round, reactive to light. Conjunctivae clear.  External ears unremarkable, canals clear with intact TMs with appropriate landmarks.  Middle ear appears open without effusion. Pink inferior turbinates.  Moist mucous membranes, pharynx without inflammation nor lesions other than mild cobblestoning.  Neck supple without palpable lymphadenopathy nor abnormal masses. Lungs: Clear to auscultation bilaterally, no wheezing/ronchi/rales.  Comfortable work of breathing. Good air movement. Extremities: No peripheral edema.  Strong peripheral pulses.  Mental Status: No depression, anxiety, nor agitation. Skin: Warm and dry.  Assessment & Plan: Lorin PicketScott was seen today for sinusitis.  Diagnoses and all orders for this visit:  Bacterial sinusitis Orders: -     doxycycline (VIBRA-TABS) 100 MG tablet; Take 1 tablet (100 mg total) by mouth 2 (two) times daily.   Bacterial sinusitis: Start doxycycline and nasal saline washes  Return if symptoms worsen or fail to improve.

## 2015-10-10 ENCOUNTER — Other Ambulatory Visit: Payer: Self-pay | Admitting: Family Medicine

## 2015-11-25 ENCOUNTER — Encounter: Payer: Self-pay | Admitting: Internal Medicine

## 2016-01-10 ENCOUNTER — Other Ambulatory Visit: Payer: Self-pay | Admitting: Family Medicine

## 2016-01-24 ENCOUNTER — Ambulatory Visit (INDEPENDENT_AMBULATORY_CARE_PROVIDER_SITE_OTHER): Payer: BLUE CROSS/BLUE SHIELD | Admitting: Family Medicine

## 2016-01-24 ENCOUNTER — Encounter: Payer: Self-pay | Admitting: Family Medicine

## 2016-01-24 VITALS — BP 128/70 | HR 73 | Wt 178.0 lb

## 2016-01-24 DIAGNOSIS — I10 Essential (primary) hypertension: Secondary | ICD-10-CM | POA: Diagnosis not present

## 2016-01-24 DIAGNOSIS — K589 Irritable bowel syndrome without diarrhea: Secondary | ICD-10-CM | POA: Diagnosis not present

## 2016-01-24 DIAGNOSIS — J0120 Acute ethmoidal sinusitis, unspecified: Secondary | ICD-10-CM

## 2016-01-24 MED ORDER — LISINOPRIL 10 MG PO TABS: 10.0000 mg | ORAL_TABLET | Freq: Every day | ORAL | Status: DC

## 2016-01-24 MED ORDER — AMOXICILLIN-POT CLAVULANATE 875-125 MG PO TABS: 1.0000 | ORAL_TABLET | Freq: Two times a day (BID) | ORAL | Status: DC

## 2016-01-24 MED ORDER — DICYCLOMINE HCL 20 MG PO TABS: 20.0000 mg | ORAL_TABLET | Freq: Four times a day (QID) | ORAL | Status: DC

## 2016-01-24 NOTE — Patient Instructions (Signed)

## 2016-01-24 NOTE — Progress Notes (Signed)
   Subjective:    Patient ID: Robert Aguilar, male    DOB: 08/30/1971, 45 y.o.   MRN: [id number]  HPI Hypertension- Pt denies chest pain, SOB, dizziness, or heart palpitations.  Taking meds as directed w/o problems.  Denies medication side effects.    Has had sinus congestion x 5 weeks.  Getting green/yellow sputum and nasal discharge.  Has been trying to haydrate.  Says it started around the time a piece of a flinstone vitamin when up his nose.  No fever, chills, or sweats.  He took some cough and cold medicine initially but says it made him feel loopy so he stopped it.  Irritable Bowel syndrome-he would like a refill on his Bentyl sent to the pharmacy. Is doing well and overall has had fairly good control of symptoms.  Review of Systems     Objective:   Physical Exam  Constitutional: He is oriented to person, place, and time. He appears well-developed and well-nourished.  HENT:  Head: Normocephalic and atraumatic.  Right Ear: External ear normal.  Left Ear: External ear normal.  Nose: Nose normal.  Mouth/Throat: Oropharynx is clear and moist.  TMs and canals are clear.   Eyes: Conjunctivae and EOM are normal. Pupils are equal, round, and reactive to light.  Neck: Neck supple. No thyromegaly present.  Cardiovascular: Normal rate and normal heart sounds.   Pulmonary/Chest: Effort normal and breath sounds normal.  Lymphadenopathy:    He has no cervical adenopathy.  Neurological: He is alert and oriented to person, place, and time.  Skin: Skin is warm and dry.  Psychiatric: He has a normal mood and affect.        Assessment & Plan:  HTN - well controlled. Continue current regimen.    Acute sinusitis - will treat with Augmentin. Call if not better in one week. Okay to continue symptomatically care.  Interval bowel syndrome-refill Bentyl today.  Declined flu shot.

## 2016-02-13 ENCOUNTER — Other Ambulatory Visit: Payer: Self-pay | Admitting: Family Medicine

## 2016-09-16 ENCOUNTER — Other Ambulatory Visit: Payer: Self-pay | Admitting: Family Medicine

## 2016-09-23 ENCOUNTER — Encounter: Payer: Self-pay | Admitting: Sports Medicine

## 2016-09-23 ENCOUNTER — Ambulatory Visit (INDEPENDENT_AMBULATORY_CARE_PROVIDER_SITE_OTHER): Payer: BLUE CROSS/BLUE SHIELD | Admitting: Sports Medicine

## 2016-09-23 DIAGNOSIS — S41111D Laceration without foreign body of right upper arm, subsequent encounter: Secondary | ICD-10-CM

## 2016-09-23 DIAGNOSIS — S41111A Laceration without foreign body of right upper arm, initial encounter: Secondary | ICD-10-CM

## 2016-09-23 HISTORY — DX: Laceration without foreign body of right upper arm, initial encounter: S41.111A

## 2016-09-23 MED ORDER — DOXYCYCLINE HYCLATE 100 MG PO TABS: 100.0000 mg | ORAL_TABLET | Freq: Two times a day (BID) | ORAL | 0 refills | Status: AC
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
## 2016-09-24 ENCOUNTER — Encounter: Payer: Self-pay | Admitting: Family Medicine

## 2016-09-24 ENCOUNTER — Ambulatory Visit (INDEPENDENT_AMBULATORY_CARE_PROVIDER_SITE_OTHER): Payer: BLUE CROSS/BLUE SHIELD | Admitting: Family Medicine

## 2016-09-24 ENCOUNTER — Ambulatory Visit: Payer: BLUE CROSS/BLUE SHIELD | Admitting: Family Medicine

## 2016-09-24 VITALS — BP 140/82 | HR 65 | Ht 68.0 in | Wt 180.0 lb

## 2016-09-24 DIAGNOSIS — E559 Vitamin D deficiency, unspecified: Secondary | ICD-10-CM

## 2016-09-24 DIAGNOSIS — Z23 Encounter for immunization: Secondary | ICD-10-CM

## 2016-09-24 DIAGNOSIS — K588 Other irritable bowel syndrome: Secondary | ICD-10-CM | POA: Diagnosis not present

## 2016-09-24 DIAGNOSIS — I10 Essential (primary) hypertension: Secondary | ICD-10-CM

## 2016-09-24 DIAGNOSIS — E785 Hyperlipidemia, unspecified: Secondary | ICD-10-CM | POA: Diagnosis not present

## 2016-09-24 DIAGNOSIS — R7989 Other specified abnormal findings of blood chemistry: Secondary | ICD-10-CM

## 2016-09-24 MED ORDER — LISINOPRIL 10 MG PO TABS: 10.0000 mg | ORAL_TABLET | Freq: Every day | ORAL | 1 refills | Status: DC

## 2016-09-24 MED ORDER — LISINOPRIL 10 MG PO TABS: 10.0000 mg | ORAL_TABLET | Freq: Every day | ORAL | 0 refills | Status: DC

## 2016-09-24 MED ORDER — DICYCLOMINE HCL 20 MG PO TABS: 20.0000 mg | ORAL_TABLET | Freq: Four times a day (QID) | ORAL | 1 refills | Status: DC

## 2016-09-24 NOTE — Progress Notes (Signed)
Subjective:    CC: HTN  HPI: Hypertension- Pt denies chest pain, SOB, dizziness, or heart palpitations.  Taking meds as directed w/o problems.  Denies medication side effects.  He has gained some weight so BPs running in 130s at home.    IBS - doing well. Needs RF on his Bentyl.   Past medical history, Surgical history, Family history not pertinant except as noted below, Social history, Allergies, and medications have been entered into the medical record, reviewed, and corrections made.   Review of Systems: No fevers, chills, night sweats, weight loss, chest pain, or shortness of breath.   Objective:    General: Well Developed, well nourished, and in no acute distress.  Neuro: Alert and oriented x3, extra-ocular muscles intact, sensation grossly intact.  HEENT: Normocephalic, atraumatic  Skin: Warm and dry, no rashes. Cardiac: Regular rate and rhythm, no murmurs rubs or gallops, no lower extremity edema.  Respiratory: Clear to auscultation bilaterally. Not using accessory muscles, speaking in full sentences.   Impression and Recommendations:    HTN - Repeat blood pressure just borderline today. We will keep an eye on it. He wants to work on trying to lose a little bit awake as he thinks that's why his blood pressure is up today. Have him follow-up in 3 months instead of 6 months.  IBS - RF sent.   Hyperlipidemia - due to recheck levels.

## 2016-12-20 ENCOUNTER — Other Ambulatory Visit: Payer: Self-pay | Admitting: Family Medicine

## 2016-12-20 DIAGNOSIS — I10 Essential (primary) hypertension: Secondary | ICD-10-CM

## 2016-12-29 ENCOUNTER — Ambulatory Visit (INDEPENDENT_AMBULATORY_CARE_PROVIDER_SITE_OTHER): Payer: BLUE CROSS/BLUE SHIELD | Admitting: Family Medicine

## 2016-12-29 ENCOUNTER — Encounter: Payer: Self-pay | Admitting: Family Medicine

## 2016-12-29 VITALS — BP 136/86 | HR 85 | Ht 68.0 in | Wt 182.0 lb

## 2016-12-29 DIAGNOSIS — R5383 Other fatigue: Secondary | ICD-10-CM | POA: Diagnosis not present

## 2016-12-29 DIAGNOSIS — M7041 Prepatellar bursitis, right knee: Secondary | ICD-10-CM | POA: Diagnosis not present

## 2016-12-29 DIAGNOSIS — E785 Hyperlipidemia, unspecified: Secondary | ICD-10-CM

## 2016-12-29 DIAGNOSIS — I10 Essential (primary) hypertension: Secondary | ICD-10-CM | POA: Diagnosis not present

## 2016-12-29 LAB — BASIC METABOLIC PANEL WITH GFR
BUN: 9 mg/dL (ref 7–25)
CHLORIDE: 103 mmol/L (ref 98–110)
CO2: 29 mmol/L (ref 20–31)
CREATININE: 0.84 mg/dL (ref 0.60–1.35)
Calcium: 9.7 mg/dL (ref 8.6–10.3)
GFR, Est African American: >89 mL/min (ref >=60)
GFR, Est Non African American: >89 mL/min (ref >=60)
Glucose, Bld: 91 mg/dL (ref 65–99)
Potassium: 4.5 mmol/L (ref 3.5–5.3)
Sodium: 141 mmol/L (ref 135–146)

## 2016-12-29 MED ORDER — LISINOPRIL-HYDROCHLOROTHIAZIDE 20-12.5 MG PO TABS: 1.0000 | ORAL_TABLET | Freq: Every day | ORAL | 1 refills | Status: DC

## 2016-12-29 NOTE — Progress Notes (Signed)
Subjective:    CC: HTN  HPI: Hypertension- Pt denies chest pain, SOB, dizziness, or heart palpitations.  Taking meds as directed w/o problems.  Denies medication side effects.    Hyperlipidemia -  He plans on becoming vegetarian.    Has had some pain and swelling on right ant knee cap.  Says was working on his knees for about 2 days. Says it was really red and swollen. Though maybe an infected hair. Says it has been getting better. Swelling is resolving. No fever.   Past medical history, Surgical history, Family history not pertinant except as noted below, Social history, Allergies, and medications have been entered into the medical record, reviewed, and corrections made.   Review of Systems: No fevers, chills, night sweats, weight loss, chest pain, or shortness of breath.   Objective:    General: Well Developed, well nourished, and in no acute distress.  Neuro: Alert and oriented x3, extra-ocular muscles intact, sensation grossly intact.  HEENT: Normocephalic, atraumatic  Skin: Warm and dry, no rashes. Cardiac: Regular rate and rhythm, no murmurs rubs or gallops, no lower extremity edema.  Respiratory: Clear to auscultation bilaterally. Not using accessory muscles, speaking in full sentences. Ext: He does have some palpable edema and swelling over the prepatellar bursa. He can feel the fluid shift with your thumb. There is a little bit of erythema on the surface of the skin but no sign of infection. Nontender. Normal knee flexion and extension.   Impression and Recommendations:   HTN - Add HCTZ to the lisinopril.  F/U in 2-3 weeks for nurse visit. Check BMP.   Hyperlipidemia - He wants to work on diet and exercise so will check lipids in 6 months.   Right prepatellar bursitis - Gave reassurance. It sounds like it's healing well. At this point if it's healing and don't think he needs to do anything else specific except for avoid recurrent pressure on that knee with kneeling. If he  needs to go conservatively take an anti-inflammatory or wrap with compression with an Ace wrap.

## 2016-12-30 LAB — TESTOSTERONE: TESTOSTERONE: 276 ng/dL (ref 250–827)

## 2017-01-01 NOTE — Progress Notes (Signed)
All labs are normal. 

## 2017-04-30 ENCOUNTER — Ambulatory Visit (INDEPENDENT_AMBULATORY_CARE_PROVIDER_SITE_OTHER): Payer: BLUE CROSS/BLUE SHIELD

## 2017-04-30 ENCOUNTER — Ambulatory Visit (INDEPENDENT_AMBULATORY_CARE_PROVIDER_SITE_OTHER): Payer: BLUE CROSS/BLUE SHIELD | Admitting: Sports Medicine

## 2017-04-30 DIAGNOSIS — S3992XA Unspecified injury of lower back, initial encounter: Secondary | ICD-10-CM

## 2017-04-30 DIAGNOSIS — M25562 Pain in left knee: Secondary | ICD-10-CM | POA: Insufficient documentation

## 2017-04-30 DIAGNOSIS — S8992XA Unspecified injury of left lower leg, initial encounter: Secondary | ICD-10-CM | POA: Diagnosis not present

## 2017-04-30 DIAGNOSIS — M25561 Pain in right knee: Secondary | ICD-10-CM

## 2017-04-30 MED ORDER — PREDNISONE 50 MG PO TABS: ORAL_TABLET | ORAL | 0 refills | Status: DC
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
## 2017-05-03 ENCOUNTER — Other Ambulatory Visit: Payer: Self-pay

## 2017-05-03 ENCOUNTER — Telehealth: Payer: Self-pay

## 2017-05-03 MED ORDER — PREDNISONE 50 MG PO TABS: ORAL_TABLET | ORAL | 0 refills | Status: DC

## 2017-05-03 NOTE — Telephone Encounter (Signed)
Pt called, dropped 2 of his prednisone down the sink.  Would you like me to send a script for those 2 to the pharmacy?  Please advise.

## 2017-05-03 NOTE — Telephone Encounter (Signed)
Yes please, and thank you

## 2017-05-03 NOTE — Telephone Encounter (Signed)
Notified patient, and sent to the pharmacy.

## 2017-05-14 ENCOUNTER — Ambulatory Visit (INDEPENDENT_AMBULATORY_CARE_PROVIDER_SITE_OTHER): Payer: BLUE CROSS/BLUE SHIELD | Admitting: Sports Medicine

## 2017-05-14 DIAGNOSIS — M25562 Pain in left knee: Secondary | ICD-10-CM | POA: Diagnosis not present

## 2017-05-14 MED ORDER — MELOXICAM 15 MG PO TABS: ORAL_TABLET | ORAL | 3 refills | Status: DC

## 2017-05-14 NOTE — Assessment & Plan Note (Signed)
Doing much better, back is normal hurting. Still has some knee pain with gelling that likely represents some degree of exacerbation of underlying osteoarthritis. Adding meloxicam, he can wear a knee sleeve alone. Keep doing the exercises.  Return to see me in a month, if he still has pain we will consider either an injection of the knee versus MRI.
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
## 2017-05-21 ENCOUNTER — Ambulatory Visit (INDEPENDENT_AMBULATORY_CARE_PROVIDER_SITE_OTHER): Payer: BLUE CROSS/BLUE SHIELD | Admitting: Sports Medicine

## 2017-05-21 DIAGNOSIS — M25562 Pain in left knee: Secondary | ICD-10-CM
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
## 2017-05-24 ENCOUNTER — Ambulatory Visit (INDEPENDENT_AMBULATORY_CARE_PROVIDER_SITE_OTHER): Payer: BLUE CROSS/BLUE SHIELD

## 2017-05-24 ENCOUNTER — Other Ambulatory Visit: Payer: Self-pay | Admitting: Family Medicine

## 2017-05-24 DIAGNOSIS — T1590XS Foreign body on external eye, part unspecified, unspecified eye, sequela: Secondary | ICD-10-CM

## 2017-05-24 DIAGNOSIS — Z77018 Contact with and (suspected) exposure to other hazardous metals: Secondary | ICD-10-CM | POA: Diagnosis not present

## 2017-05-24 DIAGNOSIS — S8982XD Other specified injuries of left lower leg, subsequent encounter: Secondary | ICD-10-CM

## 2017-05-24 DIAGNOSIS — M25562 Pain in left knee: Secondary | ICD-10-CM

## 2017-06-17 ENCOUNTER — Ambulatory Visit (INDEPENDENT_AMBULATORY_CARE_PROVIDER_SITE_OTHER): Payer: BLUE CROSS/BLUE SHIELD | Admitting: Sports Medicine

## 2017-06-17 DIAGNOSIS — M25562 Pain in left knee: Secondary | ICD-10-CM | POA: Diagnosis not present

## 2017-06-17 NOTE — Assessment & Plan Note (Signed)
Noted some fraying of the patellofemoral cartilage and partial disruption of the medial patellofemoral ligament, really we haven't been all that diligent with the rehabilitation exercises. He is going to start formal physical therapy, the injection at the last visit really didn't help much. We will do this for a solid 6 weeks before considering referral for arthroscopy or Visco supplementation.

## 2017-06-17 NOTE — Progress Notes (Signed)
  Subjective:    CC: Follow-up  HPI: Left knee pain: Injected the last visit, really didn't get much response, MRI did show some chondral fraying in the patellofemoral joint but no other internal derangement, normal menisci. Pain is mild, and is not always present, he is agreeable to proceed with formal physical therapy.  Past medical history:  Negative.  See flowsheet/record as well for more information.  Surgical history: Negative.  See flowsheet/record as well for more information.  Family history: Negative.  See flowsheet/record as well for more information.  Social history: Negative.  See flowsheet/record as well for more information.  Allergies, and medications have been entered into the medical record, reviewed, and no changes needed.   Review of Systems: No fevers, chills, night sweats, weight loss, chest pain, or shortness of breath.   Objective:    General: Well Developed, well nourished, and in no acute distress.  Neuro: Alert and oriented x3, extra-ocular muscles intact, sensation grossly intact.  HEENT: Normocephalic, atraumatic, pupils equal round reactive to light, neck supple, no masses, no lymphadenopathy, thyroid nonpalpable.  Skin: Warm and dry, no rashes. Cardiac: Regular rate and rhythm, no murmurs rubs or gallops, no lower extremity edema.  Respiratory: Clear to auscultation bilaterally. Not using accessory muscles, speaking in full sentences. Left Knee: Normal to inspection with no erythema or effusion or obvious bony abnormalities. Only mild tenderness at the medial patellar facet ROM normal in flexion and extension and lower leg rotation. Ligaments with solid consistent endpoints including ACL, PCL, LCL, MCL. Negative Mcmurray's and provocative meniscal tests. Non painful patellar compression. Patellar and quadriceps tendons unremarkable. Hamstring and quadriceps strength is normal.  Impression and Recommendations:    Patellofemoral joint pain, left Noted  some fraying of the patellofemoral cartilage and partial disruption of the medial patellofemoral ligament, really we haven't been all that diligent with the rehabilitation exercises. He is going to start formal physical therapy, the injection at the last visit really didn't help much. We will do this for a solid 6 weeks before considering referral for arthroscopy or Visco supplementation.  I spent 25 minutes with this patient, greater than 50% was face-to-face time counseling regarding the above diagnoses

## 2017-06-24 ENCOUNTER — Ambulatory Visit (INDEPENDENT_AMBULATORY_CARE_PROVIDER_SITE_OTHER): Payer: BLUE CROSS/BLUE SHIELD | Admitting: Physical Therapy

## 2017-06-24 ENCOUNTER — Encounter: Payer: Self-pay | Admitting: Physical Therapy

## 2017-06-24 DIAGNOSIS — R2242 Localized swelling, mass and lump, left lower limb: Secondary | ICD-10-CM | POA: Diagnosis not present

## 2017-06-24 DIAGNOSIS — M6281 Muscle weakness (generalized): Secondary | ICD-10-CM | POA: Diagnosis not present

## 2017-06-24 DIAGNOSIS — M25562 Pain in left knee: Secondary | ICD-10-CM

## 2017-06-24 NOTE — Patient Instructions (Addendum)
Strengthening: Straight Leg Raise (Phase 3) - keep core tight     Resting on hands, tighten muscles on front of left thigh, then l1ift leg _4-6___ inches from surface, keeping knee locked. Repeat _15___ times per set. Do __3__ sets per session. Do ____ sessions per day.  Once this is easy, lift leg, move out to the side, return to midline and lower.    Balance / Reach    Stand on left foot, Holding __0__ pound weight in other hand. Bend knee, lowering body, and reach across. Hold __1__ seconds. Relax. Repeat _10-15___ times per set. Do __3__ sets per session. Do __1__ sessions per day.  Bridging: with Straight Leg Raise    With legs bent, lift one leg up, then lift buttocks as high as able, keeping stomach tight. Repeat __15__ times per set. Do __3__ sets per session. Do __1__ sessions per day. Repeat on the other side.    IONTOPHORESIS PATIENT PRECAUTIONS & CONTRAINDICATIONS:  . Redness under one or both electrodes can occur.  This characterized by a uniform redness that usually disappears within 12 hours of treatment. . Small pinhead size blisters may result in response to the drug.  Contact your physician if the problem persists more than 24 hours. . On rare occasions, iontophoresis therapy can result in temporary skin reactions such as rash, inflammation, irritation or burns.  The skin reactions may be the result of individual sensitivity to the ionic solution used, the condition of the skin at the start of treatment, reaction to the materials in the electrodes, allergies or sensitivity to dexamethasone, or a poor connection between the patch and your skin.  Discontinue using iontophoresis if you have any of these reactions and report to your therapist. . Remove the Patch or electrodes if you have any undue sensation of pain or burning during the treatment and report discomfort to your therapist. . Tell your Therapist if you have had known adverse reactions to the application of  electrical current. . If using the Patch, the LED light will turn off when treatment is complete and the patch can be removed.  Approximate treatment time is 1-3 hours.  Remove the patch when light goes off or after 6 hours. . The Patch can be worn during normal activity, however excessive motion where the electrodes have been placed can cause poor contact between the skin and the electrode or uneven electrical current resulting in greater risk of skin irritation. Marland Kitchen. Keep out of the reach of children.   . DO NOT use if you have a cardiac pacemaker or any other electrically sensitive implanted device. . DO NOT use if you have a known sensitivity to dexamethasone. . DO NOT use during Magnetic Resonance Imaging (MRI). . DO NOT use over broken or compromised skin (e.g. sunburn, cuts, or acne) due to the increased risk of skin reaction. . DO NOT SHAVE over the area to be treated:  To establish good contact between the Patch and the skin, excessive hair may be clipped. . DO NOT place the Patch or electrodes on or over your eyes, directly over your heart, or brain. . DO NOT reuse the Patch or electrodes as this may cause burns to occur.
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
## 2017-07-05 ENCOUNTER — Other Ambulatory Visit: Payer: Self-pay | Admitting: Family Medicine

## 2017-07-07 ENCOUNTER — Ambulatory Visit (INDEPENDENT_AMBULATORY_CARE_PROVIDER_SITE_OTHER): Payer: BLUE CROSS/BLUE SHIELD | Admitting: Physical Therapy

## 2017-07-07 ENCOUNTER — Encounter: Payer: Self-pay | Admitting: Physical Therapy

## 2017-07-07 DIAGNOSIS — R2242 Localized swelling, mass and lump, left lower limb: Secondary | ICD-10-CM | POA: Diagnosis not present

## 2017-07-07 DIAGNOSIS — M6281 Muscle weakness (generalized): Secondary | ICD-10-CM

## 2017-07-07 DIAGNOSIS — M25562 Pain in left knee: Secondary | ICD-10-CM | POA: Diagnosis not present

## 2017-07-07 NOTE — Patient Instructions (Addendum)
Side Kick    Lie on side, back straight along edge of mat, legs 30 in front of torso. Lift top leg to hip height, foot flexed. Exhale, kicking forward twice. Inhale, kicking twice backward with pointed foot. Keep leg hip height, torso still. Repeat __10__ times. Repeat on other side. Do __1__ sessions per day.  Side Leg Circle    Lie on side, back straight along edge of mat, legs 30 in front of torso. Lift top leg to hip height. Rotate in small circle, CW/CCW, __10__ times in each direction. Repeat _1___ times. Repeat on other side. Do __1__ sessions per day.  Side Leg Lift    Lie on side, back straight along edge of mat, legs 30 in front of torso. Flexing foot, lift top leg to 45 without hiking hip. Lower leg, foot pointed tap in front, then lift and tap behind. Repeat _10___ times. Repeat on other side. Do __1__ sessions per day.  Quads / HF, Prone    Lie face down, knees together. Grasp one ankle with same-side hand. Use towel if needed to reach. Gently pull foot toward buttock. Hold 30-45___ seconds. Repeat _1-2__ times per session. Do _1__ sessions per day.  Gastroc Stretch    Stand with right foot back, leg straight, forward leg bent. Keeping heel on floor, turned slightly out, lean into wall until stretch is felt in calf. Hold __30-45__ seconds. Repeat __1-2__ times per set. Do _1___ sets per session. Do _1__ sessions per day.
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
## 2017-07-16 ENCOUNTER — Ambulatory Visit (INDEPENDENT_AMBULATORY_CARE_PROVIDER_SITE_OTHER): Payer: BLUE CROSS/BLUE SHIELD | Admitting: Physical Therapy

## 2017-07-16 ENCOUNTER — Encounter: Payer: Self-pay | Admitting: Physical Therapy

## 2017-07-16 DIAGNOSIS — M6281 Muscle weakness (generalized): Secondary | ICD-10-CM

## 2017-07-16 DIAGNOSIS — R2242 Localized swelling, mass and lump, left lower limb: Secondary | ICD-10-CM

## 2017-07-16 DIAGNOSIS — M25562 Pain in left knee: Secondary | ICD-10-CM
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
## 2017-07-21 ENCOUNTER — Encounter: Payer: BLUE CROSS/BLUE SHIELD | Admitting: Physical Therapy

## 2017-07-27 ENCOUNTER — Ambulatory Visit (INDEPENDENT_AMBULATORY_CARE_PROVIDER_SITE_OTHER): Payer: BLUE CROSS/BLUE SHIELD | Admitting: Physical Therapy

## 2017-07-27 ENCOUNTER — Encounter: Payer: Self-pay | Admitting: Physical Therapy

## 2017-07-27 DIAGNOSIS — M6281 Muscle weakness (generalized): Secondary | ICD-10-CM

## 2017-07-27 DIAGNOSIS — R2242 Localized swelling, mass and lump, left lower limb: Secondary | ICD-10-CM

## 2017-07-27 DIAGNOSIS — M25562 Pain in left knee: Secondary | ICD-10-CM | POA: Diagnosis not present

## 2017-07-27 NOTE — Therapy (Signed)
[address] [place], [place], [address] Phone: [phone]   Fax:  [phone]  Physical Therapy Treatment  Patient Details  Name: Robert Aguilar MRN: [id number] Date of Birth: 20-Mar-1971 Referring Provider: Dr Dianah Field  Encounter Date: 07/27/2017      PT End of Session - 07/27/17 0933    Visit Number 4   Date for PT Re-Evaluation 07/29/17   PT Start Time 0935   PT Stop Time 1017   PT Time Calculation (min) 42 min   Activity Tolerance Patient tolerated treatment well      Past Medical History:  Diagnosis Date  . Hypertension     Past Surgical History:  Procedure Laterality Date  . SHOULDER SURGERY     LT    There were no vitals filed for this visit.      Subjective Assessment - 07/27/17 0938    Subjective Pt reports he is unable to perform the jump squats as this causes some knee pain, he did try running and was fine.    Patient Stated Goals get better and not feel unstable in his knee. Run to play with kids.    Currently in Pain? No/denies            [facility] PT Assessment - 07/27/17 0001      Assessment   Medical Diagnosis Lt patellofemoral pain   Referring Provider Dr Dianah Field   Onset Date/Surgical Date 04/24/17   Hand Dominance Left     Observation/Other Assessments   Focus on Therapeutic Outcomes (FOTO)  26% limited                     [facility] Adult PT Treatment/Exercise - 07/27/17 0001      Knee/Hip Exercises: Stretches   Active Hamstring Stretch Both;1 rep;30 seconds  FWD bend   Quad Stretch Both;30 seconds   Other Knee/Hip Stretches ITB with band, cross body stretch with strap     Knee/Hip Exercises: Aerobic   Elliptical L3x5''     Knee/Hip Exercises: Standing   Other Standing Knee Exercises 10 reps surrenders, 2x10 single leg sit to stands each side    Other Standing Knee Exercises 3x10 curtsey lunges     Knee/Hip Exercises: Supine   Single Leg  Bridge 3 sets;10 reps;Both                PT Education - 07/27/17 0949    Education provided Yes   Education Details HEP progression   Person(s) Educated Patient   Methods Demonstration;Handout   Comprehension Returned demonstration;Verbalized understanding             PT Long Term Goals - 07/27/17 0939      PT LONG TERM GOAL #1   Title I with advanced HEP ( 07/29/17)    Status Achieved     PT LONG TERM GOAL #2   Title tolerate proprioception ex without difficulty ( 07/29/17)    Status Achieved     PT LONG TERM GOAL #3   Title report =/> 75% reduction in Lt knee pain with walking ( 07/29/17)    Status Achieved     PT LONG TERM GOAL #4   Title improve flexibility Lt knee to within 5 degrees of Rt without pain ( 07/29/17)    Status Achieved     PT LONG TERM GOAL #5   Title improve FOTO =/< 35% limited ( 07/29/17)    Status Achieved  scored 26% limited  Plan - 07/27/17 0949    Clinical Impression Statement Bush has met all his goals and is very pleased with his progress.  Ready for discharge to HEP    PT Treatment/Interventions Neuromuscular re-education   PT Next Visit Plan discharge   Consulted and Agree with Plan of Care Patient      Patient will benefit from skilled therapeutic intervention in order to improve the following deficits and impairments:     Visit Diagnosis: Acute pain of left knee  Localized swelling, mass and lump, left lower limb  Muscle weakness (generalized)     Problem List Patient Active Problem List   Diagnosis Date Noted  . Patellofemoral joint pain, left 04/30/2017  . Laceration of right upper arm 09/23/2016  . Family history of heart disease in male family member before age 69 04/03/2014  . IBS (irritable bowel syndrome) 12/23/2012  . Hyperlipidemia 04/05/2012  . INSOMNIA 08/20/2010  . FATIGUE 01/16/2010  . LUMBAGO 04/08/2009  . Essential hypertension, benign 01/02/2008  . ADHD 10/06/2006     SHAVER,SUE 07/27/2017, 10:15 AM  [facility] [place], [place], [address] Phone: [phone]   Fax:  [phone]  Name: ALSON MCPHEETERS MRN: [id number] Date of Birth: 03-28-1971   PHYSICAL THERAPY DISCHARGE SUMMARY  Visits from Start of Care: 4  Current functional level related to goals / functional outcomes: See above, good strength and ROM   Remaining deficits: None   Education / Equipment: HEP Plan: Patient agrees to discharge.  Patient goals were met. Patient is being discharged due to meeting the stated rehab goals.  ?????    Jeral Pinch, PT 07/27/17 10:16 AM

## 2017-07-29 ENCOUNTER — Ambulatory Visit (INDEPENDENT_AMBULATORY_CARE_PROVIDER_SITE_OTHER): Payer: BLUE CROSS/BLUE SHIELD | Admitting: Sports Medicine

## 2017-07-29 DIAGNOSIS — M25562 Pain in left knee: Secondary | ICD-10-CM

## 2017-07-29 NOTE — Progress Notes (Signed)
  Subjective:    CC: follow-up  HPI: Left patellofemoral pain: Resolved with physical therapy  Past medical history:  Negative.  See flowsheet/record as well for more information.  Surgical history: Negative.  See flowsheet/record as well for more information.  Family history: Negative.  See flowsheet/record as well for more information.  Social history: Negative.  See flowsheet/record as well for more information.  Allergies, and medications have been entered into the medical record, reviewed, and no changes needed.   Review of Systems: No fevers, chills, night sweats, weight loss, chest pain, or shortness of breath.   Objective:    General: Well Developed, well nourished, and in no acute distress.  Neuro: Alert and oriented x3, extra-ocular muscles intact, sensation grossly intact.  HEENT: Normocephalic, atraumatic, pupils equal round reactive to light, neck supple, no masses, no lymphadenopathy, thyroid nonpalpable.  Skin: Warm and dry, no rashes. Cardiac: Regular rate and rhythm, no murmurs rubs or gallops, no lower extremity edema.  Respiratory: Clear to auscultation bilaterally. Not using accessory muscles, speaking in full sentences. Left Knee: Normal to inspection with no erythema or effusion or obvious bony abnormalities. Palpation normal with no warmth or joint line tenderness or patellar tenderness or condyle tenderness. ROM normal in flexion and extension and lower leg rotation. Ligaments with solid consistent endpoints including ACL, PCL, LCL, MCL. Negative Mcmurray's and provocative meniscal tests. Non painful patellar compression. Patellar and quadriceps tendons unremarkable. Hamstring and quadriceps strength is normal.  Impression and Recommendations:    Patellofemoral joint pain, left We finally got him to do some physical therapy and he is now pain-free. Next step would be Visco supplementation. Return as needed

## 2017-07-29 NOTE — Assessment & Plan Note (Signed)
We finally got him to do some physical therapy and he is now pain-free. Next step would be Visco supplementation. Return as needed

## 2017-08-24 ENCOUNTER — Ambulatory Visit (INDEPENDENT_AMBULATORY_CARE_PROVIDER_SITE_OTHER): Payer: BLUE CROSS/BLUE SHIELD | Admitting: Family Medicine

## 2017-08-24 ENCOUNTER — Encounter: Payer: Self-pay | Admitting: Family Medicine

## 2017-08-24 VITALS — BP 126/82 | HR 79 | Ht 68.0 in | Wt 184.0 lb

## 2017-08-24 DIAGNOSIS — I10 Essential (primary) hypertension: Secondary | ICD-10-CM

## 2017-08-24 DIAGNOSIS — E785 Hyperlipidemia, unspecified: Secondary | ICD-10-CM

## 2017-08-24 MED ORDER — LISINOPRIL 20 MG PO TABS: 20.0000 mg | ORAL_TABLET | Freq: Every day | ORAL | 1 refills | Status: DC

## 2017-08-24 NOTE — Progress Notes (Signed)
   Subjective:    Patient ID: Moshe SalisburyScott W Pouncey, male    DOB: [birth year], 46 y.o.   MRN: [id number]  HPI Hypertension- Pt denies chest pain, SOB, dizziness, or heart palpitations.  Taking meds as directed w/o problems.  Denies medication side effects.  He would like to drop off the HCTZ part because says it makes him nauseated.    hyperlpidemia - not on medications.  He is trying to lose some weight. Not really exercising some.   Review of Systems     Objective:   Physical Exam  Constitutional: He is oriented to person, place, and time. He appears well-developed and well-nourished.  HENT:  Head: Normocephalic and atraumatic.  Cardiovascular: Normal rate, regular rhythm and normal heart sounds.   Pulmonary/Chest: Effort normal and breath sounds normal.  Neurological: He is alert and oriented to person, place, and time.  Skin: Skin is warm and dry.  Psychiatric: He has a normal mood and affect. His behavior is normal.       Assessment & Plan:  HTN - Well controlled. Continue current regimen. Follow up in  6 months. Due for labs.   Hyerperlipidemia -  Due to recheck levels. Continue to work on weight loss.

## 2017-09-02 LAB — COMPLETE METABOLIC PANEL WITH GFR
AG RATIO: 2 (calc) (ref 1.0–2.5)
ALT: 23 U/L (ref 9–46)
AST: 26 U/L (ref 10–40)
Albumin: 4.6 g/dL (ref 3.6–5.1)
Alkaline phosphatase (APISO): 61 U/L (ref 40–115)
BUN: 14 mg/dL (ref 7–25)
CO2: 29 mmol/L (ref 20–32)
Calcium: 9.1 mg/dL (ref 8.6–10.3)
Chloride: 105 mmol/L (ref 98–110)
Creat: 0.95 mg/dL (ref 0.60–1.35)
GFR, Est African American: 110 mL/min/{1.73_m2} (ref >=60)
GFR, Est Non African American: 95 mL/min/{1.73_m2} (ref >=60)
Globulin: 2.3 g/dL (calc) (ref 1.9–3.7)
Glucose, Bld: 103 mg/dL — ABNORMAL HIGH (ref 65–99)
POTASSIUM: 4.4 mmol/L (ref 3.5–5.3)
Sodium: 141 mmol/L (ref 135–146)
TOTAL PROTEIN: 6.9 g/dL (ref 6.1–8.1)
Total Bilirubin: 0.5 mg/dL (ref 0.2–1.2)

## 2017-09-02 LAB — LIPID PANEL W/REFLEX DIRECT LDL
Cholesterol: 231 mg/dL — ABNORMAL HIGH (ref <200)
HDL: 44 mg/dL (ref >40)
LDL Cholesterol (Calc): 153 mg/dL (calc) — ABNORMAL HIGH
NON-HDL CHOLESTEROL (CALC): 187 mg/dL — AB (ref <130)
Total CHOL/HDL Ratio: 5.3 (calc) — ABNORMAL HIGH (ref <5.0)
Triglycerides: 207 mg/dL — ABNORMAL HIGH (ref <150)

## 2017-09-02 LAB — HEMOGLOBIN A1C
HEMOGLOBIN A1C: 5.2 %{Hb} (ref <5.7)
MEAN PLASMA GLUCOSE: 103 (calc)
eAG (mmol/L): 5.7 (calc)

## 2017-10-13 ENCOUNTER — Other Ambulatory Visit: Payer: Self-pay | Admitting: Family Medicine

## 2017-11-28 ENCOUNTER — Other Ambulatory Visit: Payer: Self-pay | Admitting: Family Medicine

## 2017-11-28 DIAGNOSIS — K588 Other irritable bowel syndrome: Secondary | ICD-10-CM

## 2017-12-10 ENCOUNTER — Encounter: Payer: Self-pay | Admitting: Physician Assistant

## 2017-12-10 ENCOUNTER — Ambulatory Visit: Payer: BLUE CROSS/BLUE SHIELD | Admitting: Physician Assistant

## 2017-12-10 VITALS — BP 145/88 | HR 72 | Temp 98.3°F | Resp 16 | Wt 181.9 lb

## 2017-12-10 DIAGNOSIS — B9689 Other specified bacterial agents as the cause of diseases classified elsewhere: Secondary | ICD-10-CM | POA: Diagnosis not present

## 2017-12-10 DIAGNOSIS — J019 Acute sinusitis, unspecified: Secondary | ICD-10-CM

## 2017-12-10 MED ORDER — AMOXICILLIN-POT CLAVULANATE 875-125 MG PO TABS: 1.0000 | ORAL_TABLET | Freq: Two times a day (BID) | ORAL | 0 refills | Status: AC

## 2017-12-10 NOTE — Progress Notes (Signed)
HPI:                                                                Robert SalisburyScott W Aguilar is a 46 y.o. male who [facility] today for sinus congestion  Sinus Problem  This is a new problem. The current episode started 1 to 4 weeks ago (x 3 weeks). The problem is unchanged. The maximum temperature recorded prior to his arrival was 100.4 - 100.9 F. The fever has been present for 1 to 2 days. The pain is moderate. Associated symptoms include chills, congestion, coughing, sinus pressure and sneezing. Pertinent negatives include no neck pain or shortness of breath. Treatments tried: NSAIDs, Benadryl. The treatment provided no relief.     Past Medical History:  Diagnosis Date  . Hypertension    Past Surgical History:  Procedure Laterality Date  . SHOULDER SURGERY     LT   Social History   Tobacco Use  . Smoking status: Former Games developermoker  . Smokeless tobacco: Never Used  Substance Use Topics  . Alcohol use: No   family history includes Colon cancer in his unknown relative; Coronary artery disease in his unknown relative; Diabetes in his brother; Heart attack (age of onset: 254) in his brother; Heart disease in his father and mother; Heart failure in his father and mother; Hyperlipidemia in his father, mother, and unknown relative; Hypertension in his brother, father, mother, and unknown relative; Thyroid disease in his mother.  ROS: negative except as noted in the HPI  Medications: Current Outpatient Medications  Medication Sig Dispense Refill  . dicyclomine (BENTYL) 20 MG tablet TAKE 1 TABLET (20 MG TOTAL) BY MOUTH EVERY 6 (SIX) HOURS. 180 tablet 0  . lisinopril (PRINIVIL,ZESTRIL) 20 MG tablet Take 1 tablet (20 mg total) by mouth daily. 90 tablet 1  . meloxicam (MOBIC) 15 MG tablet One tab PO qAM with breakfast for 2 weeks, then daily prn pain. 30 tablet 3  . Multiple Vitamins-Minerals (MULTIVITAMIN WITH MINERALS) tablet Take 1 tablet  by mouth daily.     No current facility-administered medications for this visit.    No Known Allergies     Objective:  BP (!) 145/88 (BP Location: Right Arm, Patient Position: Sitting, Cuff Size: Large)   Pulse 72   Temp 98.3 F (36.8 C) (Oral)   Resp 16   Wt 181 lb 14.4 oz (82.5 kg)   SpO2 99%   BMI 27.66 kg/m  Gen:  alert, not ill-appearing, no distress, appropriate for age HEENT: head normocephalic without obvious abnormality, conjunctiva and cornea clear, nasal mucosa edematous, TM's clear bilaterally, there is frontal and maxillary sinus tenderness, oropharynx clear without edema, uvula midline, neck supple, no adenopathy, trachea midline Pulm: Normal work of breathing, normal phonation, clear to auscultation bilaterally, no wheezes, rales or rhonchi CV: Normal rate, regular rhythm, s1 and s2 distinct, no murmurs, clicks or rubs  Neuro: alert and oriented x 3, no tremor MSK: extremities atraumatic, normal gait and station Skin: intact, no rashes on exposed skin, no jaundice, no cyanosis Psych: well-groomed, cooperative, good eye contact, euthymic mood, affect mood-congruent, speech is articulate, and thought processes clear and goal-directed  Depression screen [facility] 12/10/2017  Decreased Interest 0  Down, Depressed, Hopeless  0  PHQ - 2 Score 0     No results found for this or any previous visit (from the past 72 hour(s)). No results found.    Assessment and Plan: 46 y.o. male with   1. Acute bacterial sinusitis - amoxicillin-clavulanate (AUGMENTIN) 875-125 MG tablet; Take 1 tablet by mouth 2 (two) times daily for 7 days.  Dispense: 14 tablet; Refill: 0   Patient education and anticipatory guidance given Patient agrees with treatment plan Follow-up as needed if symptoms worsen or fail to improve  Levonne Hubertharley E. Micki Cassel PA-C

## 2017-12-10 NOTE — Patient Instructions (Signed)

## 2017-12-24 ENCOUNTER — Other Ambulatory Visit: Payer: Self-pay

## 2017-12-24 MED ORDER — MELOXICAM 15 MG PO TABS: ORAL_TABLET | ORAL | 3 refills | Status: DC

## 2017-12-27 ENCOUNTER — Other Ambulatory Visit: Payer: Self-pay | Admitting: Family Medicine

## 2017-12-27 DIAGNOSIS — K588 Other irritable bowel syndrome: Secondary | ICD-10-CM

## 2018-01-11 ENCOUNTER — Other Ambulatory Visit: Payer: Self-pay | Admitting: *Deleted

## 2018-01-11 DIAGNOSIS — K588 Other irritable bowel syndrome: Secondary | ICD-10-CM

## 2018-01-11 MED ORDER — DICYCLOMINE HCL 20 MG PO TABS: 20.0000 mg | ORAL_TABLET | Freq: Four times a day (QID) | ORAL | 1 refills | Status: DC

## 2018-02-25 ENCOUNTER — Other Ambulatory Visit: Payer: Self-pay | Admitting: Family Medicine

## 2018-09-13 ENCOUNTER — Other Ambulatory Visit: Payer: Self-pay | Admitting: Family Medicine

## 2018-10-25 ENCOUNTER — Encounter: Payer: Self-pay | Admitting: Family Medicine

## 2018-10-25 ENCOUNTER — Ambulatory Visit (INDEPENDENT_AMBULATORY_CARE_PROVIDER_SITE_OTHER): Payer: No Typology Code available for payment source | Admitting: Family Medicine

## 2018-10-25 VITALS — BP 160/88 | HR 65 | Ht 67.32 in | Wt 187.0 lb

## 2018-10-25 DIAGNOSIS — Z23 Encounter for immunization: Secondary | ICD-10-CM

## 2018-10-25 DIAGNOSIS — Z0000 Encounter for general adult medical examination without abnormal findings: Secondary | ICD-10-CM | POA: Diagnosis not present

## 2018-10-25 DIAGNOSIS — I10 Essential (primary) hypertension: Secondary | ICD-10-CM

## 2018-10-25 MED ORDER — TRAZODONE HCL 50 MG PO TABS: 25.0000 mg | ORAL_TABLET | Freq: Every evening | ORAL | 3 refills | Status: DC | PRN

## 2018-10-25 MED ORDER — LOSARTAN POTASSIUM 50 MG PO TABS: 50.0000 mg | ORAL_TABLET | Freq: Every day | ORAL | 1 refills | Status: DC

## 2018-10-25 MED ORDER — HYDROCHLOROTHIAZIDE 12.5 MG PO CAPS: 12.5000 mg | ORAL_CAPSULE | Freq: Every day | ORAL | 1 refills | Status: DC

## 2018-10-25 NOTE — Progress Notes (Signed)
Subjective:    Patient ID: Robert Aguilar, male    DOB: 08-Oct-1971, 47 y.o.   MRN: [id number]  HPI 47 year old male history of hypertension and IBS is here today for complete physical exam.  He has felt a little bit more tired recently.  He would like to retry trazodone which she is used in the past.  He just feels like his sleep is not restorative.  He also let me know that he quit vaping about 3 weeks ago.  He says his appetite increased significantly so he did gain a few pounds but he feels like he is doing well.  In regards to his blood pressure the bump in the lisinopril to 20 mg has caused some more difficulty with erectile dysfunction he would like to see if there may be some other things that we could try.   Review of Systems  comprehenisve  review of systems is negative except for HPI   BP (!) 173/100   Pulse 65   Ht 5' 7.32" (1.71 m)   Wt 187 lb (84.8 kg)   SpO2 98%   BMI 29.01 kg/m     No Known Allergies  Past Medical History:  Diagnosis Date  . Hypertension     Past Surgical History:  Procedure Laterality Date  . SHOULDER SURGERY     LT    Social History   Socioeconomic History  . Marital status: Married    Spouse name: Not on file  . Number of children: Not on file  . Years of education: Not on file  . Highest education level: Not on file  Occupational History  . Not on file  Social Needs  . Financial resource strain: Not on file  . Food insecurity:    Worry: Not on file    Inability: Not on file  . Transportation needs:    Medical: Not on file    Non-medical: Not on file  Tobacco Use  . Smoking status: Former Games developermoker  . Smokeless tobacco: Never Used  Substance and Sexual Activity  . Alcohol use: No  . Drug use: No  . 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  Lifestyle  . Physical activity:    Days per week: Not on file    Minutes per  session: Not on file  . Stress: Not on file  Relationships  . Social connections:    Talks on phone: Not on file    Gets together: Not on file    Attends religious service: Not on file    Active member of club or organization: Not on file    Attends meetings of clubs or organizations: Not on file    Relationship status: Not on file  . Intimate partner violence:    Fear of current or ex partner: Not on file    Emotionally abused: Not on file    Physically abused: Not on file    Forced sexual activity: Not on file  Other Topics Concern  . Not on file  Social History Narrative  . Not on file    Family History  Problem Relation Age of Onset  . Hypertension Unknown   . Hyperlipidemia Unknown   . Heart attack Brother 54  . Colon cancer Unknown        grandmother, 2 aunts  . Coronary artery disease Unknown        family history  .  Hypertension Mother   . Hyperlipidemia Mother   . Heart disease Mother        heart attack  . Heart failure Mother   . Hypertension Father   . Hyperlipidemia Father   . Heart disease Father        heart attack  . Heart failure Father   . Diabetes Brother   . Hypertension Brother   . Thyroid disease Mother     Outpatient Encounter Medications as of 10/25/2018  Medication Sig  . dicyclomine (BENTYL) 20 MG tablet Take 1 tablet (20 mg total) by mouth every 6 (six) hours.  Marland Kitchen lisinopril (PRINIVIL,ZESTRIL) 20 MG tablet Take 1 tablet (20 mg total) by mouth daily. Needs appointment  . Multiple Vitamins-Minerals (MULTIVITAMIN WITH MINERALS) tablet Take 1 tablet by mouth daily.  . hydrochlorothiazide (MICROZIDE) 12.5 MG capsule Take 1 capsule (12.5 mg total) by mouth daily.  Marland Kitchen losartan (COZAAR) 50 MG tablet Take 1 tablet (50 mg total) by mouth daily.  . [DISCONTINUED] meloxicam (MOBIC) 15 MG tablet One tab PO qAM with breakfast for 2 weeks, then daily prn pain.   No facility-administered encounter medications on file as of 10/25/2018.           Objective:   Physical Exam  Constitutional: He is oriented to person, place, and time. He appears well-developed and well-nourished.  HENT:  Head: Normocephalic and atraumatic.  Right Ear: External ear normal.  Left Ear: External ear normal.  Nose: Nose normal.  Mouth/Throat: Oropharynx is clear and moist.  Eyes: Pupils are equal, round, and reactive to light. Conjunctivae and EOM are normal.  Neck: Normal range of motion. Neck supple. No thyromegaly present.  Cardiovascular: Normal rate, regular rhythm, normal heart sounds and intact distal pulses.  Pulmonary/Chest: Effort normal and breath sounds normal.  Abdominal: Soft. Bowel sounds are normal. He exhibits no distension and no mass. There is no tenderness. There is no rebound and no guarding.  Musculoskeletal: Normal range of motion.  Lymphadenopathy:    He has no cervical adenopathy.  Neurological: He is alert and oriented to person, place, and time. He has normal reflexes.  Skin: Skin is warm and dry.  Psychiatric: He has a normal mood and affect. His behavior is normal. Judgment and thought content normal.        Assessment & Plan:  CPE Keep up a regular exercise program and make sure you are eating a healthy diet Try to eat 4 servings of dairy a day, or if you are lactose intolerant take a calcium with vitamin D daily.  Your vaccines are up to date.  Congratulated him on stopping vaping and using nicotine.  Hypertension-we will try switching lisinopril to losartan and add a low-dose of HCTZ.  Follow-up in 2 weeks for nurse visit.

## 2018-10-25 NOTE — Addendum Note (Signed)
Addended by: Nani GasserMETHENEY, CATHERINE D on: 10/25/2018 02:24 PM   Modules accepted: Orders

## 2018-10-25 NOTE — Patient Instructions (Addendum)
Will change the lisinopril to losartan and add 12.5mg  of hctz.       Health Maintenance, Male A healthy lifestyle and preventive care is important for your health and wellness. Ask your health care provider about what schedule of regular examinations is right for you. What should I know about weight and diet? Eat a Healthy Diet  Eat plenty of vegetables, fruits, whole grains, low-fat dairy products, and lean protein.  Do not eat a lot of foods high in solid fats, added sugars, or salt.  Maintain a Healthy Weight Regular exercise can help you achieve or maintain a healthy weight. You should:  Do at least 150 minutes of exercise each week. The exercise should increase your heart rate and make you sweat (moderate-intensity exercise).  Do strength-training exercises at least twice a week.  Watch Your Levels of Cholesterol and Blood Lipids  Have your blood tested for lipids and cholesterol every 5 years starting at 49 years of age. If you are at high risk for heart disease, you should start having your blood tested when you are 49 years old. You may need to have your cholesterol levels checked more often if: ? Your lipid or cholesterol levels are high. ? You are older than 49 years of age. ? You are at high risk for heart disease.  What should I know about cancer screening? Many types of cancers can be detected early and may often be prevented. Lung Cancer  You should be screened every year for lung cancer if: ? You are a current smoker who has smoked for at least 30 years. ? You are a former smoker who has quit within the past 15 years.  Talk to your health care provider about your screening options, when you should start screening, and how often you should be screened.  Colorectal Cancer  Routine colorectal cancer screening usually begins at 49 years of age and should be repeated every 5-10 years until you are 49 years old. You may need to be screened more often if early forms of  precancerous polyps or small growths are found. Your health care provider may recommend screening at an earlier age if you have risk factors for colon cancer.  Your health care provider may recommend using home test kits to check for hidden blood in the stool.  A small camera at the end of a tube can be used to examine your colon (sigmoidoscopy or colonoscopy). This checks for the earliest forms of colorectal cancer.  Prostate and Testicular Cancer  Depending on your age and overall health, your health care provider may do certain tests to screen for prostate and testicular cancer.  Talk to your health care provider about any symptoms or concerns you have about testicular or prostate cancer.  Skin Cancer  Check your skin from head to toe regularly.  Tell your health care provider about any new moles or changes in moles, especially if: ? There is a change in a mole's size, shape, or color. ? You have a mole that is larger than a pencil eraser.  Always use sunscreen. Apply sunscreen liberally and repeat throughout the day.  Protect yourself by wearing long sleeves, pants, a wide-brimmed hat, and sunglasses when outside.  What should I know about heart disease, diabetes, and high blood pressure?  If you are 5818-49 years of age, have your blood pressure checked every 3-5 years. If you are 49 years of age or older, have your blood pressure checked every year. You should  have your blood pressure measured twice-once when you are at a hospital or clinic, and once when you are not at a hospital or clinic. Record the average of the two measurements. To check your blood pressure when you are not at a hospital or clinic, you can use: ? An automated blood pressure machine at a pharmacy. ? A home blood pressure monitor.  Talk to your health care provider about your target blood pressure.  If you are between 35-35 years old, ask your health care provider if you should take aspirin to prevent heart  disease.  Have regular diabetes screenings by checking your fasting blood sugar level. ? If you are at a normal weight and have a low risk for diabetes, have this test once every three years after the age of 72. ? If you are overweight and have a high risk for diabetes, consider being tested at a younger age or more often.  A one-time screening for abdominal aortic aneurysm (AAA) by ultrasound is recommended for men aged 20-75 years who are current or former smokers. What should I know about preventing infection? Hepatitis B If you have a higher risk for hepatitis B, you should be screened for this virus. Talk with your health care provider to find out if you are at risk for hepatitis B infection. Hepatitis C Blood testing is recommended for:  Everyone born from [birth year] through 1967.  Anyone with known risk factors for hepatitis C.  Sexually Transmitted Diseases (STDs)  You should be screened each year for STDs including gonorrhea and chlamydia if: ? You are sexually active and are younger than 49 years of age. ? You are older than 49 years of age and your health care provider tells you that you are at risk for this type of infection. ? Your sexual activity has changed since you were last screened and you are at an increased risk for chlamydia or gonorrhea. Ask your health care provider if you are at risk.  Talk with your health care provider about whether you are at high risk of being infected with HIV. Your health care provider may recommend a prescription medicine to help prevent HIV infection.  What else can I do?  Schedule regular health, dental, and eye exams.  Stay current with your vaccines (immunizations).  Do not use any tobacco products, such as cigarettes, chewing tobacco, and e-cigarettes. If you need help quitting, ask your health care provider.  Limit alcohol intake to no more than 2 drinks per day. One drink equals 12 ounces of beer, 5 ounces of wine, or 1 ounces of  hard liquor.  Do not use street drugs.  Do not share needles.  Ask your health care provider for help if you need support or information about quitting drugs.  Tell your health care provider if you often feel depressed.  Tell your health care provider if you have ever been abused or do not feel safe at home. This information is not intended to replace advice given to you by your health care provider. Make sure you discuss any questions you have with your health care provider. Document Released: 05/28/2008 Document Revised: 07/29/2016 Document Reviewed: 09/03/2015 Elsevier Interactive Patient Education  [employer].

## 2018-10-26 ENCOUNTER — Encounter: Payer: Self-pay | Admitting: Family Medicine

## 2018-10-26 ENCOUNTER — Other Ambulatory Visit: Payer: Self-pay

## 2018-10-26 MED ORDER — LOSARTAN POTASSIUM 50 MG PO TABS: 50.0000 mg | ORAL_TABLET | Freq: Every day | ORAL | 0 refills | Status: DC

## 2018-10-27 ENCOUNTER — Encounter: Payer: Self-pay | Admitting: Family Medicine

## 2018-11-08 ENCOUNTER — Ambulatory Visit: Payer: No Typology Code available for payment source

## 2018-11-08 ENCOUNTER — Ambulatory Visit: Payer: No Typology Code available for payment source | Admitting: Family Medicine

## 2018-11-08 LAB — COMPLETE METABOLIC PANEL WITH GFR
AG Ratio: 2.1 (calc) (ref 1.0–2.5)
ALT: 31 U/L (ref 9–46)
AST: 24 U/L (ref 10–40)
Albumin: 4.4 g/dL (ref 3.6–5.1)
Alkaline phosphatase (APISO): 67 U/L (ref 40–115)
BUN: 18 mg/dL (ref 7–25)
CO2: 28 mmol/L (ref 20–32)
Calcium: 9.4 mg/dL (ref 8.6–10.3)
Chloride: 104 mmol/L (ref 98–110)
Creat: 1.04 mg/dL (ref 0.60–1.35)
GFR, Est African American: 98 mL/min/{1.73_m2} (ref >=60)
GFR, Est Non African American: 85 mL/min/{1.73_m2} (ref >=60)
Globulin: 2.1 g/dL (calc) (ref 1.9–3.7)
Glucose, Bld: 100 mg/dL — ABNORMAL HIGH (ref 65–99)
Potassium: 4.1 mmol/L (ref 3.5–5.3)
Sodium: 140 mmol/L (ref 135–146)
Total Bilirubin: 0.4 mg/dL (ref 0.2–1.2)
Total Protein: 6.5 g/dL (ref 6.1–8.1)

## 2018-11-08 LAB — HEMOGLOBIN A1C
Hgb A1c MFr Bld: 5.3 % of total Hgb (ref <5.7)
Mean Plasma Glucose: 105 (calc)
eAG (mmol/L): 5.8 (calc)

## 2018-11-08 LAB — LIPID PANEL
CHOL/HDL RATIO: 5.2 (calc) — AB (ref <5.0)
Cholesterol: 197 mg/dL (ref <200)
HDL: 38 mg/dL — AB (ref >40)
LDL Cholesterol (Calc): 128 mg/dL (calc) — ABNORMAL HIGH
NON-HDL CHOLESTEROL (CALC): 159 mg/dL — AB (ref <130)
TRIGLYCERIDES: 194 mg/dL — AB (ref <150)

## 2018-11-14 ENCOUNTER — Ambulatory Visit (INDEPENDENT_AMBULATORY_CARE_PROVIDER_SITE_OTHER): Payer: No Typology Code available for payment source | Admitting: Family Medicine

## 2018-11-14 ENCOUNTER — Encounter: Payer: Self-pay | Admitting: Family Medicine

## 2018-11-14 VITALS — BP 124/75 | HR 77 | Ht 67.0 in | Wt 189.0 lb

## 2018-11-14 DIAGNOSIS — F419 Anxiety disorder, unspecified: Secondary | ICD-10-CM | POA: Diagnosis not present

## 2018-11-14 DIAGNOSIS — H9202 Otalgia, left ear: Secondary | ICD-10-CM

## 2018-11-14 DIAGNOSIS — F329 Major depressive disorder, single episode, unspecified: Secondary | ICD-10-CM | POA: Diagnosis not present

## 2018-11-14 DIAGNOSIS — F32A Depression, unspecified: Secondary | ICD-10-CM

## 2018-11-14 DIAGNOSIS — I10 Essential (primary) hypertension: Secondary | ICD-10-CM

## 2018-11-14 MED ORDER — LOSARTAN POTASSIUM-HCTZ 100-25 MG PO TABS: 1.0000 | ORAL_TABLET | Freq: Every day | ORAL | 0 refills | Status: DC

## 2018-11-14 MED ORDER — SERTRALINE HCL 50 MG PO TABS: ORAL_TABLET | ORAL | 1 refills | Status: DC

## 2018-11-14 NOTE — Progress Notes (Signed)
Subjective:    Patient ID: Robert Aguilar, male    DOB: 06-Nov-1971, 47 y.o.   MRN: [id number]  HPI Hypertension- Pt denies chest pain, SOB, dizziness, or heart palpitations.  Taking meds as directed w/o problems.  Denies medication side effects.  Switch him from lisinopril to losartan and add hydrochlorothiazide. He is now up to 100mg  on the losartan.  Blood pressures are running a little high 130/100, 142/115, 163/120, 138/100  He would ilke to discuss his mood as well. He had c/o of poor sleep when he was here last for his CPE.  He does report little interest and pleasure doing things more than half the days.  Difficulty with fatigue and low energy.  Feeling down half the days.  Difficulty with concentration and feeling bad about himself.  He also reports high levels of anxiety nearly daily.  He is not currently on prescription medication for this.  Notices anxiety has particularly ramped up since he quit smoking.  He feels like he was really using it to help himself relax and deal with stressful moments.  Also wanted me to look in his left ear today.  He said he was seen in the shower and had a Q-tip and accidentally poked his ear.  He says it is a little uncomfortable now.  Bleeding.  Review of Systems   BP (!) 146/89   Pulse 77   Ht 5\' 7"  (1.702 m)   Wt 189 lb (85.7 kg)   SpO2 100%   BMI 29.60 kg/m     No Known Allergies  Past Medical History:  Diagnosis Date  . Hypertension     Past Surgical History:  Procedure Laterality Date  . SHOULDER SURGERY     LT    Social History   Socioeconomic History  . Marital status: Married    Spouse name: Not on file  . Number of children: Not on file  . Years of education: Not on file  . Highest education level: Not on file  Occupational History  . Not on file  Social Needs  . Financial resource strain: Not on file  . Food insecurity:    Worry: Not on file    Inability: Not on file  . Transportation needs:    Medical: Not on  file    Non-medical: Not on file  Tobacco Use  . Smoking status: Former Games developer  . Smokeless tobacco: Never Used  Substance and Sexual Activity  . Alcohol use: No  . Drug use: No  . 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  Lifestyle  . Physical activity:    Days per week: Not on file    Minutes per session: Not on file  . Stress: Not on file  Relationships  . Social connections:    Talks on phone: Not on file    Gets together: Not on file    Attends religious service: Not on file    Active member of club or organization: Not on file    Attends meetings of clubs or organizations: Not on file    Relationship status: Not on file  . Intimate partner violence:    Fear of current or ex partner: Not on file    Emotionally abused: Not on file    Physically abused: Not on file    Forced sexual activity: Not on file  Other Topics Concern  . Not  on file  Social History Narrative  . Not on file    Family History  Problem Relation Age of Onset  . Hypertension Unknown   . Hyperlipidemia Unknown   . Heart attack Brother 54  . Colon cancer Unknown        grandmother, 2 aunts  . Coronary artery disease Unknown        family history  . Hypertension Mother   . Hyperlipidemia Mother   . Heart disease Mother        heart attack  . Heart failure Mother   . Hypertension Father   . Hyperlipidemia Father   . Heart disease Father        heart attack  . Heart failure Father   . Diabetes Brother   . Hypertension Brother   . Thyroid disease Mother     Outpatient Encounter Medications as of 11/14/2018  Medication Sig  . dicyclomine (BENTYL) 20 MG tablet Take 1 tablet (20 mg total) by mouth every 6 (six) hours.  . hydrochlorothiazide (MICROZIDE) 12.5 MG capsule Take 1 capsule (12.5 mg total) by mouth daily.  Marland Kitchen. losartan (COZAAR) 50 MG tablet Take 1 tablet (50 mg total) by mouth daily.  .  Multiple Vitamins-Minerals (MULTIVITAMIN WITH MINERALS) tablet Take 1 tablet by mouth daily.  . traZODone (DESYREL) 50 MG tablet Take 0.5-1 tablets (25-50 mg total) by mouth at bedtime as needed for sleep.  Marland Kitchen. losartan-hydrochlorothiazide (HYZAAR) 100-25 MG tablet Take 1 tablet by mouth daily.  . sertraline (ZOLOFT) 50 MG tablet 1/2 tab po QD x 8 days, the increase to whole tab daily  . [DISCONTINUED] lisinopril (PRINIVIL,ZESTRIL) 20 MG tablet Take 1 tablet (20 mg total) by mouth daily. Needs appointment   No facility-administered encounter medications on file as of 11/14/2018.          Objective:   Physical Exam  Constitutional: He is oriented to person, place, and time. He appears well-developed and well-nourished.  HENT:  Head: Normocephalic and atraumatic.  Nose: Nose normal.  Left TM and canal is normal.  No erythema or excoriation.  Cardiovascular: Normal rate, regular rhythm and normal heart sounds.  Pulmonary/Chest: Effort normal and breath sounds normal.  Neurological: He is alert and oriented to person, place, and time.  Skin: Skin is warm and dry.  Psychiatric: He has a normal mood and affect. His behavior is normal.          Assessment & Plan:  HTN  - better but not at goal.  He is up 2 lbs. discussed options.  We will go ahead and increase his hydrochlorthiazide up to 25 mg as well.  I will see him back in 4 weeks for mood and to recheck his blood pressure at that time.  Okay to continue to monitor BPs at home.  Anxiety/Depression - New dx.  -PHQ 9 score of 18 today and gad 7 score of 20.  No thoughts of wanting to harm himself.  Also discussed the possibility of therapy/counseling.  He declined and said he would rather start with medication.  Ear pain status post trauma-no indication of tympanic membrane perforation or excoriation of the canal.  Gave reassurance.

## 2018-11-17 ENCOUNTER — Other Ambulatory Visit: Payer: Self-pay | Admitting: Family Medicine

## 2018-11-17 NOTE — Telephone Encounter (Signed)
Does he need to be on this as well as the combo BP med?

## 2018-11-18 NOTE — Telephone Encounter (Signed)
Please call pt and let him know we declined HCTZ bc he is now on combo.

## 2018-11-18 NOTE — Telephone Encounter (Signed)
Pt advised.

## 2018-12-19 ENCOUNTER — Ambulatory Visit (INDEPENDENT_AMBULATORY_CARE_PROVIDER_SITE_OTHER): Payer: No Typology Code available for payment source | Admitting: Family Medicine

## 2018-12-19 ENCOUNTER — Encounter: Payer: Self-pay | Admitting: Family Medicine

## 2018-12-19 VITALS — BP 118/61 | HR 82 | Ht 67.0 in | Wt 189.0 lb

## 2018-12-19 DIAGNOSIS — F5101 Primary insomnia: Secondary | ICD-10-CM | POA: Diagnosis not present

## 2018-12-19 DIAGNOSIS — F32A Depression, unspecified: Secondary | ICD-10-CM

## 2018-12-19 DIAGNOSIS — I10 Essential (primary) hypertension: Secondary | ICD-10-CM

## 2018-12-19 DIAGNOSIS — R5383 Other fatigue: Secondary | ICD-10-CM | POA: Diagnosis not present

## 2018-12-19 DIAGNOSIS — F419 Anxiety disorder, unspecified: Secondary | ICD-10-CM

## 2018-12-19 DIAGNOSIS — F329 Major depressive disorder, single episode, unspecified: Secondary | ICD-10-CM

## 2018-12-19 MED ORDER — LISINOPRIL-HYDROCHLOROTHIAZIDE 20-12.5 MG PO TABS: 1.0000 | ORAL_TABLET | Freq: Every day | ORAL | 0 refills | Status: DC

## 2018-12-19 MED ORDER — LOSARTAN POTASSIUM-HCTZ 100-12.5 MG PO TABS: 1.0000 | ORAL_TABLET | Freq: Every day | ORAL | 3 refills | Status: DC

## 2018-12-19 MED ORDER — SUVOREXANT 10 MG PO TABS: 10.0000 mg | ORAL_TABLET | Freq: Every day | ORAL | 0 refills | Status: DC

## 2018-12-19 NOTE — Progress Notes (Signed)
Subjective:    CC: Hypertension and mood, new start medication  HPI:  F/U HTN -increase his HCTZ to 25 mg while I saw him about 4 weeks ago for better blood pressure control.  He is also gained a couple pounds.  Actually started exercising and running again but noticed that he has not quite built back up his stamina after 4 weeks of working out like he typically would.  He is been noticing that he will feel lightheaded if he stands up too quickly.  Follow up anxiety depression-we started him on sertraline about 4 weeks ago and he is here today for follow-up.  He was not interested in any therapy or counseling at that time.  After 8 days he did increase his dose to 50 mg for about 2 weeks but was just feeling very lightheaded dizzy and sometimes even forgetful and disoriented.  He describes it as "medicine head" so he actually went back down to 25 mg  Insomnia - he says the trazodone makes him feel groggy and medicine head in the morning. Says had a hard time for the first couple of  Hours.  He says if he does not take it at all than he just does not sleep well.  Past medical history, Surgical history, Family history not pertinant except as noted below, Social history, Allergies, and medications have been entered into the medical record, reviewed, and corrections made.   Review of Systems: No fevers, chills, night sweats, weight loss, chest pain, or shortness of breath.   Objective:    General: Well Developed, well nourished, and in no acute distress.  Neuro: Alert and oriented x3, extra-ocular muscles intact, sensation grossly intact.  HEENT: Normocephalic, atraumatic  Skin: Warm and dry, no rashes. Cardiac: Regular rate and rhythm, no murmurs rubs or gallops, no lower extremity edema.  Respiratory: Clear to auscultation bilaterally. Not using accessory muscles, speaking in full sentences.   Impression and Recommendations:    HTN - BP on the lower end today.  Looks great!  We will  decrease the HCTZ component back down to previous dose of losartan HCT 100/12.5.  Now that he is exercising we might even be able to reduce his dose over the next few months if it continues to come down.  I do think this is explaining some of the lightheadedness with position change  Anxiety/depression-discussed options.  He does feel better overall he feels like he is not as irritable and has been more of a debated to get out and do things with the kids.  His PHQ 9 score is 11 and gad 7 score is 7 which is still significant improvement from previous of 18 and 20.  I would really like to push his dose up to 50 mg if he is feeling better after reducing his blood pressure medication and after may be treating his insomnia with another medication besides trazodone.  Keep up the exercise.   Insomnia -Discussed options.  It sounds like the trazodone is causing excessive early morning sedation even though he takes it a couple of hours before bedtime.  We will try Belsomra.  Prescription printed and coupon card given for free trial.  Low energy-we will check for thyroid disorder and anemia.

## 2018-12-20 ENCOUNTER — Encounter: Payer: Self-pay | Admitting: Family Medicine

## 2018-12-20 DIAGNOSIS — I10 Essential (primary) hypertension: Secondary | ICD-10-CM

## 2018-12-22 LAB — T4, FREE: FREE T4: 1.1 ng/dL (ref 0.8–1.8)

## 2018-12-22 LAB — TSH: TSH: 7.11 mIU/L — ABNORMAL HIGH (ref 0.40–4.50)

## 2018-12-22 LAB — T3, FREE: T3, Free: 3.3 pg/mL (ref 2.3–4.2)

## 2018-12-23 ENCOUNTER — Other Ambulatory Visit: Payer: Self-pay | Admitting: *Deleted

## 2018-12-23 DIAGNOSIS — E039 Hypothyroidism, unspecified: Secondary | ICD-10-CM

## 2018-12-23 DIAGNOSIS — E038 Other specified hypothyroidism: Secondary | ICD-10-CM

## 2018-12-26 ENCOUNTER — Other Ambulatory Visit: Payer: Self-pay | Admitting: Sports Medicine

## 2018-12-27 ENCOUNTER — Encounter: Payer: Self-pay | Admitting: Family Medicine

## 2018-12-27 MED ORDER — LOSARTAN POTASSIUM 50 MG PO TABS: 50.0000 mg | ORAL_TABLET | Freq: Every day | ORAL | 1 refills | Status: DC

## 2018-12-27 MED ORDER — HYDROCHLOROTHIAZIDE 25 MG PO TABS: 25.0000 mg | ORAL_TABLET | Freq: Every day | ORAL | 1 refills | Status: DC

## 2019-01-17 ENCOUNTER — Ambulatory Visit (INDEPENDENT_AMBULATORY_CARE_PROVIDER_SITE_OTHER): Payer: No Typology Code available for payment source | Admitting: Family Medicine

## 2019-01-17 ENCOUNTER — Encounter: Payer: Self-pay | Admitting: Family Medicine

## 2019-01-17 VITALS — BP 128/78 | HR 73 | Ht 67.0 in | Wt 191.0 lb

## 2019-01-17 DIAGNOSIS — I10 Essential (primary) hypertension: Secondary | ICD-10-CM | POA: Diagnosis not present

## 2019-01-17 DIAGNOSIS — F419 Anxiety disorder, unspecified: Secondary | ICD-10-CM

## 2019-01-17 DIAGNOSIS — E039 Hypothyroidism, unspecified: Secondary | ICD-10-CM

## 2019-01-17 DIAGNOSIS — F5101 Primary insomnia: Secondary | ICD-10-CM

## 2019-01-17 DIAGNOSIS — E038 Other specified hypothyroidism: Secondary | ICD-10-CM

## 2019-01-17 DIAGNOSIS — F32A Depression, unspecified: Secondary | ICD-10-CM

## 2019-01-17 DIAGNOSIS — F329 Major depressive disorder, single episode, unspecified: Secondary | ICD-10-CM

## 2019-01-17 MED ORDER — ESCITALOPRAM OXALATE 5 MG PO TABS: 5.0000 mg | ORAL_TABLET | Freq: Every day | ORAL | 1 refills | Status: DC

## 2019-01-17 NOTE — Progress Notes (Signed)
Subjective:    CC: Mood F/U  HPI:  48 year old male is here today for follow-up anxiety/depression.  We had started him on sertraline in early December.  He was not interested in therapy or counseling at that time.  He was only able to really tolerate 25 mg as when we had gone up he felt like he had "medicine head" he just felt very forgetful and disoriented and lightheaded and dizzy.  Feel like overall he was a little less irritable.  He says a few weeks ago he actually just decided to stop the medication he just felt like overall it was not really helping.  HTN - his BP looked great at the last OV so we decided ot dec his HCTZ but started to get HAs so we went back up to 25mg  and decided to decrease the losartan to 50mg .    Insomnia-he had trouble getting the Belsomra covered.  He felt sedated in the morning on the medication. He tried it for 7 days.  So he is just been taking the trazodone.  Subclinical hypothyroid -recently on his labs his TSH was elevated around 7.1.  Free T4 and free T3 were normal so most consistent with subclinical hypothyroidism.  He want to discuss it today.  He has been feeling more tired.  He says been going to the gym regularly but cannot seem to lose weight in fact seems to be gaining.  He is just wondering if some of the symptoms could be related.  Past medical history, Surgical history, Family history not pertinant except as noted below, Social history, Allergies, and medications have been entered into the medical record, reviewed, and corrections made.   Review of Systems: No fevers, chills, night sweats, weight loss, chest pain, or shortness of breath.   Objective:    General: Well Developed, well nourished, and in no acute distress.  Neuro: Alert and oriented x3, extra-ocular muscles intact, sensation grossly intact.  HEENT: Normocephalic, atraumatic  Skin: Warm and dry, no rashes. Cardiac: Regular rate and rhythm, no murmurs rubs or gallops, no lower  extremity edema.  Respiratory: Clear to auscultation bilaterally. Not using accessory muscles, speaking in full sentences.   Impression and Recommendations:    HTN - Well controlled on new regimen. Continue current regimen. Follow up in  6 months.  Continue with diet and exercise.    Anxiety and depression - Discussed options.  Will change to Lexapro.  Added sertraline to the intolerance list.    Subclinical hypothyroidism  - due to recheck levels. This could be contributing to some of his sxs.   Insomnia  - he wants to stick with the trazodone at bedtime.

## 2019-02-06 ENCOUNTER — Encounter: Payer: Self-pay | Admitting: Family Medicine

## 2019-02-08 ENCOUNTER — Other Ambulatory Visit: Payer: Self-pay | Admitting: Family Medicine

## 2019-02-08 MED ORDER — ESCITALOPRAM OXALATE 10 MG PO TABS: 10.0000 mg | ORAL_TABLET | Freq: Every day | ORAL | 2 refills | Status: DC

## 2019-02-13 ENCOUNTER — Encounter: Payer: Self-pay | Admitting: Family Medicine

## 2019-02-13 DIAGNOSIS — R5383 Other fatigue: Secondary | ICD-10-CM

## 2019-02-13 NOTE — Telephone Encounter (Signed)
Signed.

## 2019-02-14 ENCOUNTER — Ambulatory Visit: Payer: No Typology Code available for payment source | Admitting: Family Medicine

## 2019-02-14 LAB — T4, FREE: Free T4: 1.1 ng/dL (ref 0.8–1.8)

## 2019-02-14 LAB — TSH: TSH: 6.11 mIU/L — ABNORMAL HIGH (ref 0.40–4.50)

## 2019-02-15 ENCOUNTER — Other Ambulatory Visit: Payer: Self-pay | Admitting: *Deleted

## 2019-02-15 LAB — TESTOSTERONE: Testosterone: 238 ng/dL — ABNORMAL LOW (ref 250–827)

## 2019-02-16 ENCOUNTER — Encounter: Payer: Self-pay | Admitting: Family Medicine

## 2019-02-16 ENCOUNTER — Ambulatory Visit (INDEPENDENT_AMBULATORY_CARE_PROVIDER_SITE_OTHER): Payer: Managed Care, Other (non HMO) | Admitting: Family Medicine

## 2019-02-16 VITALS — BP 117/75 | HR 85 | Ht 67.0 in | Wt 191.0 lb

## 2019-02-16 DIAGNOSIS — E039 Hypothyroidism, unspecified: Secondary | ICD-10-CM

## 2019-02-16 DIAGNOSIS — F5101 Primary insomnia: Secondary | ICD-10-CM

## 2019-02-16 DIAGNOSIS — F32A Depression, unspecified: Secondary | ICD-10-CM

## 2019-02-16 DIAGNOSIS — F419 Anxiety disorder, unspecified: Secondary | ICD-10-CM | POA: Diagnosis not present

## 2019-02-16 DIAGNOSIS — F329 Major depressive disorder, single episode, unspecified: Secondary | ICD-10-CM | POA: Diagnosis not present

## 2019-02-16 DIAGNOSIS — E038 Other specified hypothyroidism: Secondary | ICD-10-CM

## 2019-02-16 MED ORDER — LEVOTHYROXINE SODIUM 25 MCG PO TABS: 25.0000 ug | ORAL_TABLET | Freq: Every day | ORAL | 1 refills | Status: DC

## 2019-02-16 NOTE — Progress Notes (Signed)
Subjective:    CC: fatigue  HPI:  48 year old male is here today to follow-up for anxiety and depression-he is currently on Lexapro.  He went up to 10mg  about a week ago.  So far has been doing well without any significant side effects.  He says he feels more "level".  Struggling a lot with low energy.  Sleep is fair.  F/U insomnia - the trazodone is working well. He has been quartering the tab and taking it PRN.    Had his thyroid level rechecked and wanted to go over that today.  His TSH was elevated at 6 but free T4 was actually normal.  He still continues to have difficulty losing weight even though he is been exercising 2 to 3 days/week.  He has noticed cold intolerance which is new for him and just feels like he has brain fog  Is been exercising regularly recently bought a new bike.  He is biking 7 miles on the weekend.  Past medical history, Surgical history, Family history not pertinant except as noted below, Social history, Allergies, and medications have been entered into the medical record, reviewed, and corrections made.   Review of Systems: No fevers, chills, night sweats, weight loss, chest pain, or shortness of breath.   Objective:    General: Well Developed, well nourished, and in no acute distress.  Neuro: Alert and oriented x3, extra-ocular muscles intact, sensation grossly intact.  HEENT: Normocephalic, atraumatic  Skin: Warm and dry, no rashes. Cardiac: Regular rate and rhythm, no murmurs rubs or gallops, no lower extremity edema.  Respiratory: Clear to auscultation bilaterally. Not using accessory muscles, speaking in full sentences.   Impression and Recommendations:    Insomnia - doing well on current regimen.    Anxiety/Depression -doing well on Lexapro so far he is only been on a full tab for 1 week so we will continue for at least the next 4 to 6 weeks to see if he notices improvement.  But he is tolerating it well thus far.  Subclinical hypothyroid -  discussed results.  Discussed that there is debated in the in the medical community about treating clinical thyroidism.  He would really like to consider at least a trial treatment with low dose so we did discuss that today.  We will start with 25 mcg of branded Synthroid.  I will see him back in 1 to 2 months we will recheck his levels at that time and make sure that he is feeling like some of his symptoms are improving.

## 2019-02-17 ENCOUNTER — Encounter: Payer: Self-pay | Admitting: Family Medicine

## 2019-02-22 ENCOUNTER — Encounter: Payer: Self-pay | Admitting: Internal Medicine

## 2019-04-03 ENCOUNTER — Encounter: Payer: Self-pay | Admitting: Family Medicine

## 2019-04-03 ENCOUNTER — Ambulatory Visit (INDEPENDENT_AMBULATORY_CARE_PROVIDER_SITE_OTHER): Payer: Managed Care, Other (non HMO) | Admitting: Family Medicine

## 2019-04-03 VITALS — BP 108/72 | HR 76 | Ht 67.0 in | Wt 188.0 lb

## 2019-04-03 DIAGNOSIS — F419 Anxiety disorder, unspecified: Secondary | ICD-10-CM | POA: Diagnosis not present

## 2019-04-03 DIAGNOSIS — E038 Other specified hypothyroidism: Secondary | ICD-10-CM | POA: Insufficient documentation

## 2019-04-03 DIAGNOSIS — E291 Testicular hypofunction: Secondary | ICD-10-CM

## 2019-04-03 DIAGNOSIS — R7989 Other specified abnormal findings of blood chemistry: Secondary | ICD-10-CM | POA: Diagnosis not present

## 2019-04-03 DIAGNOSIS — I10 Essential (primary) hypertension: Secondary | ICD-10-CM | POA: Diagnosis not present

## 2019-04-03 DIAGNOSIS — F329 Major depressive disorder, single episode, unspecified: Secondary | ICD-10-CM

## 2019-04-03 DIAGNOSIS — F32A Depression, unspecified: Secondary | ICD-10-CM

## 2019-04-03 DIAGNOSIS — E039 Hypothyroidism, unspecified: Secondary | ICD-10-CM | POA: Diagnosis not present

## 2019-04-03 MED ORDER — TRAZODONE HCL 50 MG PO TABS: ORAL_TABLET | ORAL | 3 refills | Status: DC

## 2019-04-03 MED ORDER — LEVOTHYROXINE SODIUM 25 MCG PO TABS: 25.0000 ug | ORAL_TABLET | Freq: Every day | ORAL | 1 refills | Status: DC

## 2019-04-03 NOTE — Progress Notes (Signed)
Virtual Visit via Video Note  I connected with Robert Aguilar on 04/03/19 at  8:50 AM EDT by a video enabled telemedicine application and verified that I am speaking with the correct person using two identifiers.   I discussed the limitations of evaluation and management by telemedicine and the availability of in person appointments. The patient expressed understanding and agreed to proceed.  Subjective:    CC: Mood   HPI:  F/U mood - depression/anxiety - he has been on lexapro '10mg'$   about 6 weeks. Feels like the medication is making him feel bloated and upper abdomen is distended.  Started before the thyroid pill. Wonders if could be the HCTZ or the lexapro.   Hypertension- Pt denies chest pain, SOB, dizziness, or heart palpitations.  Taking meds as directed w/o problems.  Denies medication side effects.    Follow-up subclinical hypothyroidism-when we had met back in March we had discussed possible treatment as he felt like he was having some persistent symptoms including fatigue and difficulty losing weight.  We decided to start with low-dose 25 mcg daily Synthroid. Taking on empty stomach. Still feels very fatigue.  Noticed some improvement initially but now back to normal.    Past medical history, Surgical history, Family history not pertinant except as noted below, Social history, Allergies, and medications have been entered into the medical record, reviewed, and corrections made.   Review of Systems: No fevers, chills, night sweats, weight loss, chest pain, or shortness of breath.   Objective:    General: Speaking clearly in complete sentences without any shortness of breath.  Alert and oriented x3.  Normal judgment. No apparent acute distress.  Well-groomed.    Impression and Recommendations:    Depression/Anxiety - doing well on the medication.  Will continue with medication.   HTN - OK to hold the hctz for 2 weeks , or cut in half, and see if your symptoms improve in regards  to abdominal bloating.  Abdominal bloating-possibly medication side effect.  He did some research and feels like it could either be the HCTZ or the Lexapro.  We discussed maybe cutting back on the HCTZ initially since he has been doing well on the Lexapro thus far.  Subclinical hypothyroidism. Due to recheck levels.  Can adjust dose as needed.  Noticed some improvement initially but now back to normal.    Low testosterone - due to rechecks and plan to start replacement therapy if still low.    I discussed the assessment and treatment plan with the patient. The patient was provided an opportunity to ask questions and all were answered. The patient agreed with the plan and demonstrated an understanding of the instructions.   The patient was advised to call back or seek an in-person evaluation if the symptoms worsen or if the condition fails to improve as anticipated.   Beatrice Lecher, MD

## 2019-04-05 ENCOUNTER — Encounter: Payer: Self-pay | Admitting: Family Medicine

## 2019-04-05 LAB — TESTOSTERONE TOTAL,FREE,BIO, MALES
Albumin: 4.3 g/dL (ref 3.6–5.1)
Sex Hormone Binding: 13 nmol/L (ref 10–50)
Testosterone: 217 ng/dL — ABNORMAL LOW (ref 250–827)

## 2019-04-05 LAB — T4, FREE: Free T4: 1.1 ng/dL (ref 0.8–1.8)

## 2019-04-05 LAB — TSH: TSH: 4.68 mIU/L — ABNORMAL HIGH (ref 0.40–4.50)

## 2019-04-05 MED ORDER — LEVOTHYROXINE SODIUM 50 MCG PO TABS: 50.0000 ug | ORAL_TABLET | Freq: Every day | ORAL | 1 refills | Status: DC

## 2019-04-05 NOTE — Addendum Note (Signed)
Addended by: Nani Gasser D on: 04/05/2019 07:35 AM   Modules accepted: Orders

## 2019-04-06 NOTE — Addendum Note (Signed)
Addended by: Nani Gasser D on: 04/06/2019 12:08 PM   Modules accepted: Orders

## 2019-04-08 LAB — IRON,TIBC AND FERRITIN PANEL
%SAT: 32 % (calc) (ref 20–48)
Ferritin: 136 ng/mL (ref 38–380)
Iron: 106 ug/dL (ref 50–180)
TIBC: 336 mcg/dL (calc) (ref 250–425)

## 2019-04-08 LAB — T4, FREE: Free T4: 1.1 ng/dL (ref 0.8–1.8)

## 2019-04-08 LAB — FOLLICLE STIMULATING HORMONE: FSH: 7.3 m[IU]/mL (ref 1.6–8.0)

## 2019-04-08 LAB — PROLACTIN: Prolactin: 4.5 ng/mL (ref 2.0–18.0)

## 2019-04-08 LAB — TSH+FREE T4: TSH W/REFLEX TO FT4: 5.32 mIU/L — ABNORMAL HIGH (ref 0.40–4.50)

## 2019-04-08 LAB — LUTEINIZING HORMONE: LH: 4.9 m[IU]/mL (ref 1.5–9.3)

## 2019-04-10 ENCOUNTER — Encounter: Payer: Self-pay | Admitting: Family Medicine

## 2019-04-11 MED ORDER — TESTOSTERONE 20.25 MG/ACT (1.62%) TD GEL: TRANSDERMAL | 1 refills | Status: DC

## 2019-04-21 ENCOUNTER — Ambulatory Visit (INDEPENDENT_AMBULATORY_CARE_PROVIDER_SITE_OTHER): Payer: Managed Care, Other (non HMO) | Admitting: Family Medicine

## 2019-04-21 ENCOUNTER — Encounter: Payer: Self-pay | Admitting: Family Medicine

## 2019-04-21 VITALS — BP 153/91 | HR 71 | Ht 67.0 in | Wt 197.0 lb

## 2019-04-21 DIAGNOSIS — F419 Anxiety disorder, unspecified: Secondary | ICD-10-CM | POA: Diagnosis not present

## 2019-04-21 DIAGNOSIS — F32A Depression, unspecified: Secondary | ICD-10-CM

## 2019-04-21 DIAGNOSIS — M7712 Lateral epicondylitis, left elbow: Secondary | ICD-10-CM | POA: Diagnosis not present

## 2019-04-21 DIAGNOSIS — F329 Major depressive disorder, single episode, unspecified: Secondary | ICD-10-CM | POA: Diagnosis not present

## 2019-04-21 DIAGNOSIS — R7989 Other specified abnormal findings of blood chemistry: Secondary | ICD-10-CM

## 2019-04-21 MED ORDER — AMITRIPTYLINE HCL 25 MG PO TABS: 25.0000 mg | ORAL_TABLET | Freq: Every day | ORAL | 1 refills | Status: DC

## 2019-04-21 NOTE — Addendum Note (Signed)
Addended by: Nani Gasser D on: 04/21/2019 04:53 PM   Modules accepted: Orders

## 2019-04-21 NOTE — Progress Notes (Addendum)
Acute Office Visit  Subjective:    Patient ID: Robert Aguilar, male    DOB: 1971-11-19, 48 y.o.   MRN: [id number]  Chief Complaint  Patient presents with  . Elbow Pain    HPI Patient is in today for lateral epicondylitis.  He says starting in about December he started to get a little bit of pain in that left lateral elbow.  He had been doing an exercise program at home.  Which is bother him occasionally but more recently has become persistent.  He has had an injection there several years ago maybe around 2014.  He would like to have that done again if at all possible.   He actually had right lateral epicondylitis back in 2014 which we injected and patient did well with.  Hypogonadism follow-up-he did start the AndroGel.  He is been on it for about a week so far.  He did have some questions about how long it can be on the skin before he does not have to worry about it coming into contact with his children or his wife.  He also normally sleeps without a T-shirt and so has been sleeping with a T-shirt just to avoid any rubbing of the gel onto the fabric or sheets.  He says he works out in the middle of the day so does not usually take a shower until right after lunch maybe between 1-3 o'clock and so has been applying the gel after his shower because he was concerned that if he applied it in the morning and then wife washed it off in the afternoon that it would not be nearly as efficacious.  He also reports that he started cutting his Lexapro in half.  He was taking it for anxiety and depression.  He noticed a change in his bowels with it.  Past Medical History:  Diagnosis Date  . Hypertension     Past Surgical History:  Procedure Laterality Date  . SHOULDER SURGERY     LT    Family History  Problem Relation Age of Onset  . Hypertension Other   . Hyperlipidemia Other   . Heart attack Brother 54  . Colon cancer Other        grandmother, 2 aunts  . Coronary artery disease Other         family history  . Hypertension Mother   . Hyperlipidemia Mother   . Heart disease Mother        heart attack  . Heart failure Mother   . Thyroid disease Mother   . Hypertension Father   . Hyperlipidemia Father   . Heart disease Father        heart attack  . Heart failure Father   . Diabetes Brother   . Hypertension Brother     Social History   Socioeconomic History  . Marital status: Married    Spouse name: Not on file  . Number of children: Not on file  . Years of education: Not on file  . Highest education level: Not on file  Occupational History  . Not on file  Social Needs  . Financial resource strain: Not on file  . Food insecurity:    Worry: Not on file    Inability: Not on file  . Transportation needs:    Medical: Not on file    Non-medical: Not on file  Tobacco Use  . Smoking status: Former Games developer  . Smokeless tobacco: Never Used  Substance and Sexual Activity  .  Alcohol use: No  . Drug use: No  . 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  Lifestyle  . Physical activity:    Days per week: Not on file    Minutes per session: Not on file  . Stress: Not on file  Relationships  . Social connections:    Talks on phone: Not on file    Gets together: Not on file    Attends religious service: Not on file    Active member of club or organization: Not on file    Attends meetings of clubs or organizations: Not on file    Relationship status: Not on file  . Intimate partner violence:    Fear of current or ex partner: Not on file    Emotionally abused: Not on file    Physically abused: Not on file    Forced sexual activity: Not on file  Other Topics Concern  . Not on file  Social History Narrative  . Not on file    Outpatient Medications Prior to Visit  Medication Sig Dispense Refill  . dicyclomine (BENTYL) 20 MG tablet Take 1 tablet (20 mg total) by  mouth every 6 (six) hours. 360 tablet 1  . escitalopram (LEXAPRO) 10 MG tablet Take 1 tablet (10 mg total) by mouth daily. 30 tablet 2  . hydrochlorothiazide (HYDRODIURIL) 25 MG tablet Take 1 tablet (25 mg total) by mouth daily. 90 tablet 1  . levothyroxine (SYNTHROID) 50 MCG tablet Take 1 tablet (50 mcg total) by mouth daily. 30 tablet 1  . losartan (COZAAR) 50 MG tablet Take 1 tablet (50 mg total) by mouth daily. 90 tablet 1  . Multiple Vitamins-Minerals (MULTIVITAMIN WITH MINERALS) tablet Take 1 tablet by mouth daily.    . Testosterone 20.25 MG/ACT (1.62%) GEL 1 pump to each outer arm QAM. 75 g 1  . traZODone (DESYREL) 50 MG tablet TAKE 0.5 1 TABLETS (25 50 MG TOTAL) BY MOUTH AT BEDTIME AS NEEDED FOR SLEEP. 30 tablet 3   No facility-administered medications prior to visit.     Allergies  Allergen Reactions  . Sertraline Other (See Comments)    Dizziness    ROS     Objective:    Physical Exam  Constitutional: He is oriented to person, place, and time. He appears well-developed and well-nourished.  HENT:  Head: Normocephalic and atraumatic.  Eyes: Conjunctivae and EOM are normal.  Cardiovascular: Normal rate.  Pulmonary/Chest: Effort normal.  Musculoskeletal:     Comments: Left elbow with normal appearance no erythema or significant swelling.  Normal range of motion of the elbow.  He had increased pain with supination against resistance at the left lateral epicondyle and tenderness directly over that area.  Nontender over the olecranon or the medial epicondyle.  Neurological: He is alert and oriented to person, place, and time.  Skin: Skin is dry. No pallor.  Psychiatric: He has a normal mood and affect. His behavior is normal.  Vitals reviewed.   BP (!) 153/91   Pulse 71   Ht 5\' 7"  (1.702 m)   Wt 197 lb (89.4 kg)   SpO2 95%   BMI 30.85 kg/m  Wt Readings from Last 3 Encounters:  04/21/19 197 lb (89.4 kg)  04/03/19 188 lb (85.3 kg)  02/16/19 191 lb (86.6 kg)     There are no preventive care reminders to display for this patient.  There are no  preventive care reminders to display for this patient.   Lab Results  Component Value Date   TSH 4.68 (H) 04/04/2019   Lab Results  Component Value Date   WBC 5.0 02/02/2014   HGB 15.0 02/02/2014   HCT 43.4 02/02/2014   MCV 89.9 02/02/2014   PLT 216 02/02/2014   Lab Results  Component Value Date   NA 140 11/07/2018   K 4.1 11/07/2018   CO2 28 11/07/2018   GLUCOSE 100 (H) 11/07/2018   BUN 18 11/07/2018   CREATININE 1.04 11/07/2018   BILITOT 0.4 11/07/2018   ALKPHOS 69 02/28/2015   AST 24 11/07/2018   ALT 31 11/07/2018   PROT 6.5 11/07/2018   ALBUMIN 4.2 02/28/2015   CALCIUM 9.4 11/07/2018   Lab Results  Component Value Date   CHOL 197 11/07/2018   Lab Results  Component Value Date   HDL 38 (L) 11/07/2018   Lab Results  Component Value Date   LDLCALC 128 (H) 11/07/2018   Lab Results  Component Value Date   TRIG 194 (H) 11/07/2018   Lab Results  Component Value Date   CHOLHDL 5.2 (H) 11/07/2018   Lab Results  Component Value Date   HGBA1C 5.3 11/07/2018       Assessment & Plan:   Problem List Items Addressed This Visit    None    Visit Diagnoses    Left lateral epicondylitis    -  Primary   Low testosterone         Left lateral epicondylitis-okay to use anti-inflammatories.  Ice as needed.  Also given handout for stretches and rehab to do on his own at home.  Did perform injection for pain relief today.  Patient to call if any worsening symptoms or not improving.  Hypogonadism-we did discuss additional options beyond the AndroGel which he is currently using we could do [place] which is placed on the inner thigh especially since he has some concerns about it being on his upper outer shoulders and exposing his children or his wife.  Also discussed the injections and how they work and how they release and the timing of the injections.  His wife is a nurse so he  would like to consider the injections but for now we will stick with the AndroGel and then do his follow-up blood work in about a month.  Anxiety/depression-we will discontinue Lexapro and try amitriptyline which could also help with his irritable bowel syndrome.  We will start with 25 mg and then increase to 50 mg if tolerated.  Can adjust dose from there if needed.  Elbow Injection Procedure Note  Pre-operative Diagnosis: left lateral epicondylitis  Post-operative Diagnosis: same  Indications: pain relief  Anesthesia: Lidocaine 1% without epinephrine without added sodium bicarbonate  Procedure Details   Verbal consent was obtained for the procedure. The point of maximum tenderness was identified and marked. The skin prepped with Betadine and a small wheel of anesthetic was injected into the subcutaneous tissue. A needle was advanced to the left lateral epicondyle and then just slightly withdrawn before 1 mL of 1% lidocaine mixed with 1 mL of 40 mg Kenalog was deposited.  Pinch technique used to avoid atrophy of the skin.  453}   Complications:  None; patient tolerated the procedure well.    No orders of the defined types were placed in this encounter.    Nani Gasser, MD

## 2019-04-28 ENCOUNTER — Encounter: Payer: Self-pay | Admitting: Family Medicine

## 2019-04-29 ENCOUNTER — Other Ambulatory Visit: Payer: Self-pay | Admitting: Family Medicine

## 2019-05-01 ENCOUNTER — Encounter: Payer: Self-pay | Admitting: Family Medicine

## 2019-05-01 MED ORDER — TESTOSTERONE CYPIONATE 200 MG/ML IM SOLN: 200.0000 mg | INTRAMUSCULAR | 0 refills | Status: DC

## 2019-05-14 ENCOUNTER — Other Ambulatory Visit: Payer: Self-pay | Admitting: Family Medicine

## 2019-05-15 ENCOUNTER — Encounter: Payer: Self-pay | Admitting: Family Medicine

## 2019-05-15 DIAGNOSIS — R1011 Right upper quadrant pain: Secondary | ICD-10-CM

## 2019-05-15 DIAGNOSIS — R1012 Left upper quadrant pain: Secondary | ICD-10-CM

## 2019-05-15 DIAGNOSIS — R0602 Shortness of breath: Secondary | ICD-10-CM

## 2019-05-15 DIAGNOSIS — R140 Abdominal distension (gaseous): Secondary | ICD-10-CM

## 2019-05-16 NOTE — Addendum Note (Signed)
Addended by: Nani Gasser D on: 05/16/2019 04:39 PM   Modules accepted: Orders

## 2019-05-17 ENCOUNTER — Other Ambulatory Visit: Payer: Self-pay

## 2019-05-17 ENCOUNTER — Ambulatory Visit (INDEPENDENT_AMBULATORY_CARE_PROVIDER_SITE_OTHER): Payer: Managed Care, Other (non HMO)

## 2019-05-17 DIAGNOSIS — R1012 Left upper quadrant pain: Secondary | ICD-10-CM

## 2019-05-17 DIAGNOSIS — R0602 Shortness of breath: Secondary | ICD-10-CM | POA: Diagnosis not present

## 2019-05-17 DIAGNOSIS — R1011 Right upper quadrant pain: Secondary | ICD-10-CM

## 2019-05-26 ENCOUNTER — Encounter: Payer: Self-pay | Admitting: Family Medicine

## 2019-05-26 MED ORDER — ESCITALOPRAM OXALATE 10 MG PO TABS: ORAL_TABLET | ORAL | 1 refills | Status: DC

## 2019-05-26 MED ORDER — LISINOPRIL 20 MG PO TABS: 20.0000 mg | ORAL_TABLET | Freq: Every day | ORAL | 0 refills | Status: DC

## 2019-05-26 NOTE — Addendum Note (Signed)
Addended by: Beatrice Lecher D on: 05/26/2019 12:53 PM   Modules accepted: Orders

## 2019-05-29 ENCOUNTER — Encounter: Payer: Self-pay | Admitting: Family Medicine

## 2019-05-30 MED ORDER — AMPHETAMINE-DEXTROAMPHET ER 15 MG PO CP24: 15.0000 mg | ORAL_CAPSULE | ORAL | 0 refills | Status: DC

## 2019-05-30 NOTE — Addendum Note (Signed)
Addended by: Beatrice Lecher D on: 05/30/2019 09:58 AM   Modules accepted: Orders

## 2019-06-05 ENCOUNTER — Encounter: Payer: Self-pay | Admitting: Family Medicine

## 2019-06-05 ENCOUNTER — Telehealth: Payer: Self-pay

## 2019-06-05 ENCOUNTER — Ambulatory Visit (INDEPENDENT_AMBULATORY_CARE_PROVIDER_SITE_OTHER): Payer: Managed Care, Other (non HMO) | Admitting: Family Medicine

## 2019-06-05 VITALS — BP 160/94 | HR 99 | Temp 98.6°F | Wt 198.0 lb

## 2019-06-05 DIAGNOSIS — E039 Hypothyroidism, unspecified: Secondary | ICD-10-CM

## 2019-06-05 DIAGNOSIS — I10 Essential (primary) hypertension: Secondary | ICD-10-CM

## 2019-06-05 DIAGNOSIS — E038 Other specified hypothyroidism: Secondary | ICD-10-CM

## 2019-06-05 DIAGNOSIS — R451 Restlessness and agitation: Secondary | ICD-10-CM | POA: Diagnosis not present

## 2019-06-05 MED ORDER — CARVEDILOL 6.25 MG PO TABS: 6.2500 mg | ORAL_TABLET | Freq: Two times a day (BID) | ORAL | 3 refills | Status: DC

## 2019-06-05 MED ORDER — HYDROCHLOROTHIAZIDE 25 MG PO TABS: 25.0000 mg | ORAL_TABLET | Freq: Every day | ORAL | 1 refills | Status: DC

## 2019-06-05 MED ORDER — LISINOPRIL 20 MG PO TABS: 40.0000 mg | ORAL_TABLET | Freq: Every day | ORAL | 0 refills | Status: DC

## 2019-06-05 NOTE — Patient Instructions (Addendum)
Thank you for coming in today. COntinue lisinopril 40 and HCTZ 25.  Add coreg 6.25 twice daily.  Get labs now.  Continue lexapro 5 or 10. 5 is probably better for a few days.  Check with Dr Madilyn Fireman via video visit in 1 week.  Keep track of blood pressure and provide log to her and me.   Carvedilol tablets What is this medicine? CARVEDILOL (KAR ve dil ol) is a beta-blocker. Beta-blockers reduce the workload on the heart and help it to beat more regularly. This medicine is used to treat high blood pressure and heart failure. This medicine may be used for other purposes; ask your health care provider or pharmacist if you have questions. COMMON BRAND NAME(S): Coreg What should I tell my health care provider before I take this medicine? They need to know if you have any of these conditions: -circulation problems -diabetes -history of heart attack or heart disease -liver disease -lung or breathing disease, like asthma or emphysema -pheochromocytoma -slow or irregular heartbeat -thyroid disease -an unusual or allergic reaction to carvedilol, other beta-blockers, medicines, foods, dyes, or preservatives -pregnant or trying to get pregnant -breast-feeding How should I use this medicine? Take this medicine by mouth with a glass of water. Follow the directions on the prescription label. It is best to take the tablets with food. Take your doses at regular intervals. Do not take your medicine more often than directed. Do not stop taking except on the advice of your doctor or health care professional. Talk to your pediatrician regarding the use of this medicine in children. Special care may be needed. Overdosage: If you think you have taken too much of this medicine contact a poison control center or emergency room at once. NOTE: This medicine is only for you. Do not share this medicine with others. What if I miss a dose? If you miss a dose, take it as soon as you can. If it is almost time for  your next dose, take only that dose. Do not take double or extra doses. What may interact with this medicine? This medicine may interact with the following medications: -certain medicines for blood pressure, heart disease, irregular heart beat -certain medicines for depression, like fluoxetine or paroxetine -certain medicines for diabetes, like glipizide or glyburide -cimetidine -clonidine -cyclosporine -digoxin -MAOIs like Carbex, Eldepryl, Marplan, Nardil, and Parnate -reserpine -rifampin This list may not describe all possible interactions. Give your health care provider a list of all the medicines, herbs, non-prescription drugs, or dietary supplements you use. Also tell them if you smoke, drink alcohol, or use illegal drugs. Some items may interact with your medicine. What should I watch for while using this medicine? Check your heart rate and blood pressure regularly while you are taking this medicine. Ask your doctor or health care professional what your heart rate and blood pressure should be, and when you should contact him or her. Do not stop taking this medicine suddenly. This could lead to serious heart-related effects. Contact your doctor or health care professional if you have difficulty breathing while taking this drug. Check your weight daily. Ask your doctor or health care professional when you should notify him/her of any weight gain. You may get drowsy or dizzy. Do not drive, use machinery, or do anything that requires mental alertness until you know how this medicine affects you. To reduce the risk of dizzy or fainting spells, do not sit or stand up quickly. Alcohol can make you more drowsy, and increase flushing and  rapid heartbeats. Avoid alcoholic drinks. If you have diabetes, check your blood sugar as directed. Tell your doctor if you have changes in your blood sugar while you are taking this medicine. If you are going to have surgery, tell your doctor or health care  professional that you are taking this medicine. What side effects may I notice from receiving this medicine? Side effects that you should report to your doctor or health care professional as soon as possible: -allergic reactions like skin rash, itching or hives, swelling of the face, lips, or tongue -breathing problems -dark urine -irregular heartbeat -swollen legs or ankles -vomiting -yellowing of the eyes or skin Side effects that usually do not require medical attention (report to your doctor or health care professional if they continue or are bothersome): -change in sex drive or performance -diarrhea -dry eyes (especially if wearing contact lenses) -dry, itching skin -headache -nausea -unusually tired This list may not describe all possible side effects. Call your doctor for medical advice about side effects. You may report side effects to FDA at 1-800-FDA-1088. Where should I keep my medicine? Keep out of the reach of children. Store at room temperature below 30 degrees C (86 degrees F). Protect from moisture. Keep container tightly closed. Throw away any unused medicine after the expiration date. NOTE: This sheet is a summary. It may not cover all possible information. If you have questions about this medicine, talk to your doctor, pharmacist, or health care provider.  2019 Elsevier/Gold Standard (2013-08-06 14:12:02)

## 2019-06-05 NOTE — Progress Notes (Signed)
Robert Aguilar is a 48 y.o. male who presents to [facility] today for hypertension.  HTN: For blood pressure have been adjusting medicine. Recently taking Lisniopril 40 and HCTZ 25. BP has been 180/100s at home.  He denies chest pain or palpitations or shortness of breath.  He notes a mild headache.  He denies any weakness or numbness or loss of function.  Patient was started on Adderall recently for ADHD symptoms.  He is having racing thoughts and difficulty concentrating.  Adderall was prescribed on the 16th.  In the interim he thinks it may have helped a bit.   Started Lexapro about 2 weeks ago. Feeling adjitated recently.  He started at 5 mg and increase to 10.  He stopped it for 2 days and restarted 10 mg yesterday.  He is not sure if it helped.   ROS as above:  Exam:  BP (!) 160/94   Pulse 99   Temp 98.6 F (37 C) (Oral)   Wt 198 lb (89.8 kg)   BMI 31.01 kg/m  Wt Readings from Last 5 Encounters:  06/05/19 198 lb (89.8 kg)  04/21/19 197 lb (89.4 kg)  04/03/19 188 lb (85.3 kg)  02/16/19 191 lb (86.6 kg)  01/17/19 191 lb (86.6 kg)    Gen: Well NAD nontoxic appearing HEENT: EOMI,  MMM Lungs: Normal work of breathing. CTABL Heart: RRR no MRG Abd: NABS, Soft. Nondistended, Nontender Exts: Brisk capillary refill, warm and well perfused.  Psych alert and oriented normal speech thought process and affect.   Assessment and Plan: 48 y.o. male with hypertension.  Blood pressure elevated today.  Fundamental etiology is somewhat unclear.  Plan for lab work-up.  Will check CMP.  We will also recheck TSH given history of abnormal thyroid values recently with management of levothyroxine.  Blood pressure is elevated today.  Will add Coreg twice daily and adjust dose.  Additionally patient is complaining of some agitation and recently had his Lexapro and Adderall  doses adjusted.  Recommend continuing stable regimen of that and recheck with PCP in near future.  Recheck video visit with PCP in 1 week. PDMP reviewed during this encounter. Orders Placed This Encounter  Procedures  . COMPLETE METABOLIC PANEL WITH GFR  . TSH  . T4, free   Meds ordered this encounter  Medications  . hydrochlorothiazide (HYDRODIURIL) 25 MG tablet    Sig: Take 1 tablet (25 mg total) by mouth daily.    Dispense:  90 tablet    Refill:  1  . lisinopril (ZESTRIL) 20 MG tablet    Sig: Take 2 tablets (40 mg total) by mouth daily.    Dispense:  90 tablet    Refill:  0  . carvedilol (COREG) 6.25 MG tablet    Sig: Take 1 tablet (6.25 mg total) by mouth 2 (two) times daily with a meal.    Dispense:  60 tablet    Refill:  3     Historical information moved to improve visibility of documentation.  Past Medical History:  Diagnosis Date  . Hypertension    Past Surgical History:  Procedure Laterality Date  . SHOULDER SURGERY     LT   Social History   Tobacco Use  . Smoking status: Former Research scientist (life sciences)  . Smokeless tobacco: Never Used  Substance Use Topics  . Alcohol use: No   family history includes Colon cancer in an other family member; Coronary artery disease  in an other family member; Diabetes in his brother; Heart attack (age of onset: 4554) in his brother; Heart disease in his father and mother; Heart failure in his father and mother; Hyperlipidemia in his father, mother, and another family member; Hypertension in his brother, father, mother, and another family member; Thyroid disease in his mother.  Medications: Current Outpatient Medications  Medication Sig Dispense Refill  . amitriptyline (ELAVIL) 25 MG tablet Take 2 tablets (50 mg total) by mouth at bedtime. 180 tablet 1  . amphetamine-dextroamphetamine (ADDERALL XR) 15 MG 24 hr capsule Take 1 capsule by mouth every morning. 30 capsule 0  . carvedilol (COREG) 6.25 MG tablet Take 1 tablet (6.25 mg total) by mouth  2 (two) times daily with a meal. 60 tablet 3  . dicyclomine (BENTYL) 20 MG tablet Take 1 tablet (20 mg total) by mouth every 6 (six) hours. 360 tablet 1  . escitalopram (LEXAPRO) 10 MG tablet 1/2 tab po QD x 6 days the increase to whole tab daily 30 tablet 1  . hydrochlorothiazide (HYDRODIURIL) 25 MG tablet Take 1 tablet (25 mg total) by mouth daily. 90 tablet 1  . lisinopril (ZESTRIL) 20 MG tablet Take 2 tablets (40 mg total) by mouth daily. 90 tablet 0  . Multiple Vitamins-Minerals (MULTIVITAMIN WITH MINERALS) tablet Take 1 tablet by mouth daily.    Marland Kitchen. SYNTHROID 50 MCG tablet TAKE 1 TABLET BY MOUTH EVERY DAY 30 tablet 1  . traZODone (DESYREL) 50 MG tablet TAKE 0.5 1 TABLETS (25 50 MG TOTAL) BY MOUTH AT BEDTIME AS NEEDED FOR SLEEP. 30 tablet 3   No current facility-administered medications for this visit.    Allergies  Allergen Reactions  . Sertraline Other (See Comments)    Dizziness     Discussed warning signs or symptoms. Please see discharge instructions. Patient expresses understanding.

## 2019-06-05 NOTE — Telephone Encounter (Signed)
See MyChart message

## 2019-06-06 ENCOUNTER — Telehealth: Payer: Self-pay | Admitting: Family Medicine

## 2019-06-06 DIAGNOSIS — R1011 Right upper quadrant pain: Secondary | ICD-10-CM

## 2019-06-06 DIAGNOSIS — R140 Abdominal distension (gaseous): Secondary | ICD-10-CM

## 2019-06-06 DIAGNOSIS — R1012 Left upper quadrant pain: Secondary | ICD-10-CM

## 2019-06-06 LAB — COMPLETE METABOLIC PANEL WITH GFR
AG Ratio: 1.8 (calc) (ref 1.0–2.5)
ALT: 38 U/L (ref 9–46)
AST: 31 U/L (ref 10–40)
Albumin: 4.8 g/dL (ref 3.6–5.1)
Alkaline phosphatase (APISO): 63 U/L (ref 36–130)
BUN: 16 mg/dL (ref 7–25)
CO2: 31 mmol/L (ref 20–32)
Calcium: 10.7 mg/dL — ABNORMAL HIGH (ref 8.6–10.3)
Chloride: 99 mmol/L (ref 98–110)
Creat: 1.11 mg/dL (ref 0.60–1.35)
GFR, Est African American: 90 mL/min/{1.73_m2} (ref >=60)
GFR, Est Non African American: 78 mL/min/{1.73_m2} (ref >=60)
Globulin: 2.6 g/dL (calc) (ref 1.9–3.7)
Glucose, Bld: 87 mg/dL (ref 65–99)
Potassium: 3.9 mmol/L (ref 3.5–5.3)
Sodium: 139 mmol/L (ref 135–146)
Total Bilirubin: 0.7 mg/dL (ref 0.2–1.2)
Total Protein: 7.4 g/dL (ref 6.1–8.1)

## 2019-06-06 LAB — T4, FREE: Free T4: 1.2 ng/dL (ref 0.8–1.8)

## 2019-06-06 LAB — TSH: TSH: 2.69 mIU/L (ref 0.40–4.50)

## 2019-06-06 NOTE — Telephone Encounter (Signed)
I had called patient about the results when he had seen Dr. Georgina Snell and wants to know if you still want to do a Celiac test. If so when he comes back he is willing to do this. Please advise.

## 2019-06-06 NOTE — Addendum Note (Signed)
Addended by: Gregor Hams on: 06/06/2019 06:34 AM   Modules accepted: Orders

## 2019-06-06 NOTE — Telephone Encounter (Signed)
Lab ordered.

## 2019-06-06 NOTE — Telephone Encounter (Signed)
Patient was notified and did not have any questions.  

## 2019-06-07 ENCOUNTER — Encounter: Payer: Self-pay | Admitting: Family Medicine

## 2019-06-08 LAB — PTH, INTACT AND CALCIUM
Calcium: 9.9 mg/dL (ref 8.6–10.3)
PTH: 26 pg/mL (ref 14–64)

## 2019-06-08 LAB — PHOSPHORUS: Phosphorus: 3.5 mg/dL (ref 2.5–4.5)

## 2019-06-08 LAB — CELIAC DISEASE PANEL
(tTG) Ab, IgA: 1 U/mL
(tTG) Ab, IgG: 3 U/mL
Gliadin IgA: 4 Units
Gliadin IgG: 3 Units
Immunoglobulin A: 111 mg/dL (ref 47–310)

## 2019-06-08 LAB — VITAMIN D 25 HYDROXY (VIT D DEFICIENCY, FRACTURES): Vit D, 25-Hydroxy: 38 ng/mL (ref 30–100)

## 2019-06-13 ENCOUNTER — Ambulatory Visit (INDEPENDENT_AMBULATORY_CARE_PROVIDER_SITE_OTHER): Payer: Managed Care, Other (non HMO) | Admitting: Family Medicine

## 2019-06-13 ENCOUNTER — Encounter: Payer: Self-pay | Admitting: Family Medicine

## 2019-06-13 VITALS — BP 121/74 | HR 72 | Ht 67.0 in | Wt 194.0 lb

## 2019-06-13 DIAGNOSIS — F329 Major depressive disorder, single episode, unspecified: Secondary | ICD-10-CM

## 2019-06-13 DIAGNOSIS — I10 Essential (primary) hypertension: Secondary | ICD-10-CM

## 2019-06-13 DIAGNOSIS — F419 Anxiety disorder, unspecified: Secondary | ICD-10-CM | POA: Diagnosis not present

## 2019-06-13 DIAGNOSIS — F902 Attention-deficit hyperactivity disorder, combined type: Secondary | ICD-10-CM | POA: Diagnosis not present

## 2019-06-13 DIAGNOSIS — K588 Other irritable bowel syndrome: Secondary | ICD-10-CM

## 2019-06-13 DIAGNOSIS — F32A Depression, unspecified: Secondary | ICD-10-CM

## 2019-06-13 MED ORDER — LOSARTAN POTASSIUM 100 MG PO TABS: 100.0000 mg | ORAL_TABLET | Freq: Every day | ORAL | 0 refills | Status: DC

## 2019-06-13 MED ORDER — TRAZODONE HCL 50 MG PO TABS: ORAL_TABLET | ORAL | 3 refills | Status: DC

## 2019-06-13 NOTE — Assessment & Plan Note (Signed)
Pressure looks fantastic today.  We will continue with current regimen though he wants to switch the lisinopril back to losartan.  He still has some at home so okay to do so.  I will see him back in 6 weeks.

## 2019-06-13 NOTE — Assessment & Plan Note (Addendum)
Trial of Linzess.  We will discontinue amitriptyline for medication list as it was too sedating. Start FODMAP diet.

## 2019-06-13 NOTE — Assessment & Plan Note (Signed)
Discussed that he has not been on the Lexapro long enough to really see a significant improvement so I really want him to continue it for at least the next month before we consider switching medications.  Follow-up in 6 weeks.

## 2019-06-13 NOTE — Assessment & Plan Note (Signed)
Discontinue Adderall for now.  I do think that was the most likely culprit of his increased blood pressure.  We can always consider a non-stimulant in the future.

## 2019-06-13 NOTE — Progress Notes (Signed)
Established Patient Office Visit  Subjective:  Patient ID: Robert Aguilar, Robert Aguilar    DOB: [birth year]  Age: 48 y.o. MRN: [id number]  CC:  Chief Complaint  Patient presents with  . Hypertension    HPI Robert Aguilar presents for   Hypertension-he came in with uncontrolled blood pressures a couple of weeks ago and saw 1 of my partners.  Coreg was added to his current regimen to help control them.  He was started on Lexapro as well as a stimulant right around that time. He also stopped his Adderral. He had only taken it for one day when his BP shot up and caused HA. He is feeling better on the Coreg.  He would also like to change the lisinipril back to losartan. He still has some at home.    Follow-up anxiety/depression- he is now up to 10mg  of the lexapro.   He has been on the 10mg  for a little over a week and hasn't noticed significant change or improvement. No negative side effects,  IBS - he felt the [place] was too [place].  He said it just made him feel groggy all day.  He did see GI yesterday and they have put him on a FODMAP diet as well as a trial of Linzess.   Past Medical History:  Diagnosis Date  . Hypertension   . Laceration of right upper arm 09/23/2016    Past Surgical History:  Procedure Laterality Date  . SHOULDER SURGERY     LT    Family History  Problem Relation Age of Onset  . Hypertension Other   . Hyperlipidemia Other   . Heart attack Brother 2  . Colon cancer Other        grandmother, 2 aunts  . Coronary artery disease Other        family history  . Hypertension Mother   . Hyperlipidemia Mother   . Heart disease Mother        heart attack  . Heart failure Mother   . Thyroid disease Mother   . Hypertension Father   . Hyperlipidemia Father   . Heart disease Father        heart attack  . Heart failure Father   . Diabetes Brother   . Hypertension Brother     Social History   Socioeconomic History  . Marital status: Married    Spouse  name: Not on file  . Number of children: Not on file  . Years of education: Not on file  . Highest education level: Not on file  Occupational History  . Not on file  Social Needs  . Financial resource strain: Not on file  . Food insecurity    Worry: Not on file    Inability: Not on file  . Transportation needs    Medical: Not on file    Non-medical: Not on file  Tobacco Use  . Smoking status: Former Research scientist (life sciences)  . Smokeless tobacco: Never Used  Substance and Sexual Activity  . Alcohol use: No  . Drug use: No  . 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  Lifestyle  . Physical activity    Days per week: Not on file    Minutes per session: Not on file  . Stress: Not on file  Relationships  . Social connections    Talks on phone: Not on file  Gets together: Not on file    Attends religious service: Not on file    Active member of club or organization: Not on file    Attends meetings of clubs or organizations: Not on file    Relationship status: Not on file  . Intimate partner violence    Fear of current or ex partner: Not on file    Emotionally abused: Not on file    Physically abused: Not on file    Forced sexual activity: Not on file  Other Topics Concern  . Not on file  Social History Narrative  . Not on file    Outpatient Medications Prior to Visit  Medication Sig Dispense Refill  . carvedilol (COREG) 6.25 MG tablet Take 1 tablet (6.25 mg total) by mouth 2 (two) times daily with a meal. 60 tablet 3  . dicyclomine (BENTYL) 20 MG tablet Take 1 tablet (20 mg total) by mouth every 6 (six) hours. 360 tablet 1  . escitalopram (LEXAPRO) 10 MG tablet 1/2 tab po QD x 6 days the increase to whole tab daily 30 tablet 1  . hydrochlorothiazide (HYDRODIURIL) 25 MG tablet Take 1 tablet (25 mg total) by mouth daily. 90 tablet 1  . linaclotide (LINZESS) 72 MCG capsule Take 1 capsule by  mouth daily.    . Multiple Vitamins-Minerals (MULTIVITAMIN WITH MINERALS) tablet Take 1 tablet by mouth daily.    Marland Kitchen. SYNTHROID 50 MCG tablet TAKE 1 TABLET BY MOUTH EVERY DAY 30 tablet 1  . amitriptyline (ELAVIL) 25 MG tablet Take 2 tablets (50 mg total) by mouth at bedtime. 180 tablet 1  . lisinopril (ZESTRIL) 20 MG tablet Take 2 tablets (40 mg total) by mouth daily. 90 tablet 0  . traZODone (DESYREL) 50 MG tablet TAKE 0.5 1 TABLETS (25 50 MG TOTAL) BY MOUTH AT BEDTIME AS NEEDED FOR SLEEP. 30 tablet 3  . amphetamine-dextroamphetamine (ADDERALL XR) 15 MG 24 hr capsule Take 1 capsule by mouth every morning. 30 capsule 0   No facility-administered medications prior to visit.     Allergies  Allergen Reactions  . Adderall [Amphetamine-Dextroamphetamine] Other (See Comments)    Increase BP  . Amitriptyline Other (See Comments)    Excess Sedation  . Sertraline Other (See Comments)    Dizziness    ROS Review of Systems    Objective:    Physical Exam  Constitutional: He is oriented to person, place, and time. He appears well-developed and well-nourished.  HENT:  Head: Normocephalic and atraumatic.  Eyes: Conjunctivae and EOM are normal.  Cardiovascular: Normal rate, regular rhythm and normal heart sounds.  Pulmonary/Chest: Effort normal and breath sounds normal.  Neurological: He is alert and oriented to person, place, and time.  Skin: Skin is warm and dry. No pallor.  Psychiatric: He has a normal mood and affect. His behavior is normal.  Vitals reviewed.   BP 121/74   Pulse 72   Ht 5\' 7"  (1.702 m)   Wt 194 lb (88 kg)   SpO2 97%   BMI 30.38 kg/m  Wt Readings from Last 3 Encounters:  06/13/19 194 lb (88 kg)  06/05/19 198 lb (89.8 kg)  04/21/19 197 lb (89.4 kg)     There are no preventive care reminders to display for this patient.  There are no preventive care reminders to display for this patient.  Lab Results  Component Value Date   TSH 2.69 06/05/2019   Lab  Results  Component Value Date   WBC 5.0 02/02/2014  HGB 15.0 02/02/2014   HCT 43.4 02/02/2014   MCV 89.9 02/02/2014   PLT 216 02/02/2014   Lab Results  Component Value Date   NA 139 06/05/2019   K 3.9 06/05/2019   CO2 31 06/05/2019   GLUCOSE 87 06/05/2019   BUN 16 06/05/2019   CREATININE 1.11 06/05/2019   BILITOT 0.7 06/05/2019   ALKPHOS 69 02/28/2015   AST 31 06/05/2019   ALT 38 06/05/2019   PROT 7.4 06/05/2019   ALBUMIN 4.2 02/28/2015   CALCIUM 9.9 06/07/2019   Lab Results  Component Value Date   CHOL 197 11/07/2018   Lab Results  Component Value Date   HDL 38 (L) 11/07/2018   Lab Results  Component Value Date   LDLCALC 128 (H) 11/07/2018   Lab Results  Component Value Date   TRIG 194 (H) 11/07/2018   Lab Results  Component Value Date   CHOLHDL 5.2 (H) 11/07/2018   Lab Results  Component Value Date   HGBA1C 5.3 11/07/2018      Assessment & Plan:   Problem List Items Addressed This Visit      Cardiovascular and Mediastinum   Essential hypertension, benign - Primary    Pressure looks fantastic today.  We will continue with current regimen though he wants to switch the lisinopril back to losartan.  He still has some at home so okay to do so.  I will see him back in 6 weeks.      Relevant Medications   losartan (COZAAR) 100 MG tablet     Digestive   IBS (irritable bowel syndrome)    Trial of Linzess.  We will discontinue amitriptyline for medication list as it was too sedating. Start FODMAP diet.        Relevant Medications   linaclotide (LINZESS) 72 MCG capsule     Other   Anxiety and depression    Discussed that he has not been on the Lexapro long enough to really see a significant improvement so I really want him to continue it for at least the next month before we consider switching medications.  Follow-up in 6 weeks.      Relevant Medications   traZODone (DESYREL) 50 MG tablet   ADHD    Discontinue Adderall for now.  I do think that  was the most likely culprit of his increased blood pressure.  We can always consider a non-stimulant in the future.         Meds ordered this encounter  Medications  . traZODone (DESYREL) 50 MG tablet    Sig: TAKE 0.5 1 TABLETS (25 50 MG TOTAL) BY MOUTH AT BEDTIME AS NEEDED FOR SLEEP.    Dispense:  30 tablet    Refill:  3  . losartan (COZAAR) 100 MG tablet    Sig: Take 1 tablet (100 mg total) by mouth daily.    Dispense:  1 tablet    Refill:  0    Follow-up: Return in about 6 weeks (around 07/25/2019) for mood.    Nani Gasseratherine Tyner Codner, MD

## 2019-06-17 ENCOUNTER — Other Ambulatory Visit: Payer: Self-pay | Admitting: Family Medicine

## 2019-06-19 LAB — HM COLONOSCOPY

## 2019-06-25 ENCOUNTER — Other Ambulatory Visit: Payer: Self-pay | Admitting: Family Medicine

## 2019-07-24 ENCOUNTER — Other Ambulatory Visit: Payer: Self-pay | Admitting: Family Medicine

## 2019-08-29 ENCOUNTER — Other Ambulatory Visit: Payer: Self-pay | Admitting: Family Medicine

## 2019-09-27 ENCOUNTER — Other Ambulatory Visit: Payer: Self-pay | Admitting: Family Medicine

## 2019-10-31 ENCOUNTER — Other Ambulatory Visit: Payer: Self-pay | Admitting: Family Medicine

## 2019-11-23 ENCOUNTER — Other Ambulatory Visit: Payer: Self-pay | Admitting: Family Medicine

## 2019-12-04 ENCOUNTER — Other Ambulatory Visit: Payer: Self-pay | Admitting: Family Medicine

## 2020-01-03 ENCOUNTER — Other Ambulatory Visit: Payer: Self-pay | Admitting: Family Medicine

## 2020-01-16 ENCOUNTER — Other Ambulatory Visit: Payer: Self-pay | Admitting: Family Medicine

## 2020-03-05 ENCOUNTER — Other Ambulatory Visit: Payer: Self-pay | Admitting: Family Medicine

## 2020-06-12 ENCOUNTER — Encounter: Payer: Self-pay | Admitting: Family Medicine

## 2020-06-12 ENCOUNTER — Other Ambulatory Visit: Payer: Self-pay | Admitting: Family Medicine

## 2020-06-12 MED ORDER — LOSARTAN POTASSIUM 100 MG PO TABS: 100.0000 mg | ORAL_TABLET | Freq: Every day | ORAL | 0 refills | Status: DC

## 2020-06-12 MED ORDER — HYDROCHLOROTHIAZIDE 25 MG PO TABS: 25.0000 mg | ORAL_TABLET | Freq: Every day | ORAL | 1 refills | Status: DC

## 2020-06-21 ENCOUNTER — Ambulatory Visit (INDEPENDENT_AMBULATORY_CARE_PROVIDER_SITE_OTHER): Payer: Managed Care, Other (non HMO) | Admitting: Family Medicine

## 2020-06-21 ENCOUNTER — Encounter: Payer: Self-pay | Admitting: Family Medicine

## 2020-06-21 VITALS — BP 130/75 | HR 79 | Temp 98.1°F | Ht 66.93 in | Wt 186.3 lb

## 2020-06-21 DIAGNOSIS — E039 Hypothyroidism, unspecified: Secondary | ICD-10-CM | POA: Diagnosis not present

## 2020-06-21 DIAGNOSIS — I10 Essential (primary) hypertension: Secondary | ICD-10-CM

## 2020-06-21 DIAGNOSIS — E785 Hyperlipidemia, unspecified: Secondary | ICD-10-CM | POA: Diagnosis not present

## 2020-06-21 DIAGNOSIS — Z0000 Encounter for general adult medical examination without abnormal findings: Secondary | ICD-10-CM

## 2020-06-21 NOTE — Patient Instructions (Signed)
Great to meet you! We'll be in touch with lab results.    Preventive Care 40-51 Years Old, Male Preventive care refers to lifestyle choices and visits with your health care provider that can promote health and wellness. This includes:  A yearly physical exam. This is also called an annual well check.  Regular dental and eye exams.  Immunizations.  Screening for certain conditions.  Healthy lifestyle choices, such as eating a healthy diet, getting regular exercise, not using drugs or products that contain nicotine and tobacco, and limiting alcohol use. What can I expect for my preventive care visit? Physical exam Your health care provider will check:  Height and weight. These may be used to calculate body mass index (BMI), which is a measurement that tells if you are at a healthy weight.  Heart rate and blood pressure.  Your skin for abnormal spots. Counseling Your health care provider may ask you questions about:  Alcohol, tobacco, and drug use.  Emotional well-being.  Home and relationship well-being.  Sexual activity.  Eating habits.  Work and work environment. What immunizations do I need?  Influenza (flu) vaccine  This is recommended every year. Tetanus, diphtheria, and pertussis (Tdap) vaccine  You may need a Td booster every 10 years. Varicella (chickenpox) vaccine  You may need this vaccine if you have not already been vaccinated. Zoster (shingles) vaccine  You may need this after age 60. Measles, mumps, and rubella (MMR) vaccine  You may need at least one dose of MMR if you were born in 1957 or later. You may also need a second dose. Pneumococcal conjugate (PCV13) vaccine  You may need this if you have certain conditions and were not previously vaccinated. Pneumococcal polysaccharide (PPSV23) vaccine  You may need one or two doses if you smoke cigarettes or if you have certain conditions. Meningococcal conjugate (MenACWY) vaccine  You may need  this if you have certain conditions. Hepatitis A vaccine  You may need this if you have certain conditions or if you travel or work in places where you may be exposed to hepatitis A. Hepatitis B vaccine  You may need this if you have certain conditions or if you travel or work in places where you may be exposed to hepatitis B. Haemophilus influenzae type b (Hib) vaccine  You may need this if you have certain risk factors. Human papillomavirus (HPV) vaccine  If recommended by your health care provider, you may need three doses over 6 months. You may receive vaccines as individual doses or as more than one vaccine together in one shot (combination vaccines). Talk with your health care provider about the risks and benefits of combination vaccines. What tests do I need? Blood tests  Lipid and cholesterol levels. These may be checked every 5 years, or more frequently if you are over 50 years old.  Hepatitis C test.  Hepatitis B test. Screening  Lung cancer screening. You may have this screening every year starting at age 55 if you have a 30-pack-year history of smoking and currently smoke or have quit within the past 15 years.  Prostate cancer screening. Recommendations will vary depending on your family history and other risks.  Colorectal cancer screening. All adults should have this screening starting at age 50 and continuing until age 75. Your health care provider may recommend screening at age 45 if you are at increased risk. You will have tests every 1-10 years, depending on your results and the type of screening test.  Diabetes screening.   This is done by checking your blood sugar (glucose) after you have not eaten for a while (fasting). You may have this done every 1-3 years.  Sexually transmitted disease (STD) testing. Follow these instructions at home: Eating and drinking  Eat a diet that includes fresh fruits and vegetables, whole grains, lean protein, and low-fat dairy  products.  Take vitamin and mineral supplements as recommended by your health care provider.  Do not drink alcohol if your health care provider tells you not to drink.  If you drink alcohol: ? Limit how much you have to 0-2 drinks a day. ? Be aware of how much alcohol is in your drink. In the [place] one drink equals one 12 oz bottle of beer (355 mL), one 5 oz glass of wine (148 mL), or one 1 oz glass of hard liquor (44 mL). Lifestyle  Take daily care of your teeth and gums.  Stay active. Exercise for at least 30 minutes on 5 or more days each week.  Do not use any products that contain nicotine or tobacco, such as cigarettes, e-cigarettes, and chewing tobacco. If you need help quitting, ask your health care provider.  If you are sexually active, practice safe sex. Use a condom or other form of protection to prevent STIs (sexually transmitted infections).  Talk with your health care provider about taking a low-dose aspirin every day starting at age 50. What's next?  Go to your health care provider once a year for a well check visit.  Ask your health care provider how often you should have your eyes and teeth checked.  Stay up to date on all vaccines. This information is not intended to replace advice given to you by your health care provider. Make sure you discuss any questions you have with your health care provider. Document Revised: 11/24/2018 Document Reviewed: 11/24/2018 Elsevier Patient Education  2020 Elsevier Inc.  

## 2020-06-23 ENCOUNTER — Encounter: Payer: Self-pay | Admitting: Family Medicine

## 2020-06-23 DIAGNOSIS — Z0000 Encounter for general adult medical examination without abnormal findings: Secondary | ICD-10-CM | POA: Insufficient documentation

## 2020-06-23 NOTE — Progress Notes (Signed)
Robert Aguilar - 49 y.o. male MRN [id number]  Date of birth: [birth year]  Subjective Chief Complaint  Patient presents with  . Annual Exam    HPI Robert Aguilar is a 49 y.o. male here today for annual exam.  He has a history of HTN, anxiety with insomnia, and subclinical hypothyroidism.  He was started on levothyroxine but has discontinued.  He thinks that he may not have been getting enough iodine in his diet and wants to have several labs checked to assess his thyroid in addition to routine labs.   He is a non-smoker and consume a few servings of EtOH each week.    He stays pretty active and feels that he follows a fairly healthy diet.    Review of Systems  Constitutional: Negative for chills, fever, malaise/fatigue and weight loss.  HENT: Negative for congestion, ear pain and sore throat.   Eyes: Negative for blurred vision, double vision and pain.  Respiratory: Negative for cough and shortness of breath.   Cardiovascular: Negative for chest pain and palpitations.  Gastrointestinal: Negative for abdominal pain, blood in stool, constipation, heartburn and nausea.  Genitourinary: Negative for dysuria and urgency.  Musculoskeletal: Negative for joint pain and myalgias.  Neurological: Negative for dizziness and headaches.  Endo/Heme/Allergies: Does not bruise/bleed easily.  Psychiatric/Behavioral: Negative for depression. The patient is not nervous/anxious and does not have insomnia.     Allergies  Allergen Reactions  . Adderall [Amphetamine-Dextroamphetamine] Other (See Comments)    Increase BP  . Amitriptyline Other (See Comments)    Excess Sedation  . Sertraline Other (See Comments)    Dizziness    Past Medical History:  Diagnosis Date  . Hypertension   . Laceration of right upper arm 09/23/2016    Past Surgical History:  Procedure Laterality Date  . SHOULDER SURGERY     LT    Social History   Socioeconomic History  . Marital status: Married    Spouse name:  Not on file  . Number of children: Not on file  . Years of education: Not on file  . Highest education level: Not on file  Occupational History  . Not on file  Tobacco Use  . Smoking status: Former Games developer  . Smokeless tobacco: Never Used  Substance and Sexual Activity  . Alcohol use: No  . Drug use: No  . 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  Other Topics Concern  . Not on file  Social History Narrative  . Not on file   Social Determinants of Health   Financial Resource Strain:   . Difficulty of Paying Living Expenses:   Food Insecurity:   . Worried About Programme researcher, broadcasting/film/video in the Last Year:   . Barista in the Last Year:   Transportation Needs:   . Freight forwarder (Medical):   Marland Kitchen Lack of Transportation (Non-Medical):   Physical Activity:   . Days of Exercise per Week:   . Minutes of Exercise per Session:   Stress:   . Feeling of Stress :   Social Connections:   . Frequency of Communication with Friends and Family:   . Frequency of Social Gatherings with Friends and Family:   . Attends Religious Services:   . Active Member of Clubs or Organizations:   . Attends Banker Meetings:   Marland Kitchen Marital Status:     Family  History  Problem Relation Age of Onset  . Hypertension Other   . Hyperlipidemia Other   . Heart attack Brother 54  . Colon cancer Other        grandmother, 2 aunts  . Coronary artery disease Other        family history  . Hypertension Mother   . Hyperlipidemia Mother   . Heart disease Mother        heart attack  . Heart failure Mother   . Thyroid disease Mother   . Hypertension Father   . Hyperlipidemia Father   . Heart disease Father        heart attack  . Heart failure Father   . Diabetes Brother   . Hypertension Brother     Health Maintenance  Topic Date Due  . Hepatitis C Screening  Never done  . COLONOSCOPY   11/10/2019  . COVID-19 Vaccine (1) 09/13/2020 (Originally 11/09/1983)  . INFLUENZA VACCINE  07/14/2020  . TETANUS/TDAP  09/24/2026  . HIV Screening  Completed     ----------------------------------------------------------------------------------------------------------------------------------------------------------------------------------------------------------------- Physical Exam BP 130/75 (BP Location: Left Arm, Patient Position: Sitting, Cuff Size: Normal)   Pulse 79   Temp 98.1 F (36.7 C)   Ht 5' 6.93" (1.7 m)   Wt 186 lb 4.8 oz (84.5 kg)   SpO2 100%   BMI 29.24 kg/m   Physical Exam Constitutional:      General: He is not in acute distress. HENT:     Head: Normocephalic and atraumatic.     Right Ear: External ear normal.     Left Ear: External ear normal.  Eyes:     General: No scleral icterus. Neck:     Thyroid: No thyromegaly.  Cardiovascular:     Rate and Rhythm: Normal rate and regular rhythm.     Heart sounds: Normal heart sounds.  Pulmonary:     Effort: Pulmonary effort is normal.     Breath sounds: Normal breath sounds.  Abdominal:     General: Bowel sounds are normal. There is no distension.     Palpations: Abdomen is soft.     Tenderness: There is no abdominal tenderness. There is no guarding.  Musculoskeletal:     Cervical back: Normal range of motion and neck supple.  Lymphadenopathy:     Cervical: No cervical adenopathy.  Skin:    General: Skin is warm and dry.     Findings: No rash.  Neurological:     Mental Status: He is alert and oriented to person, place, and time.     Cranial Nerves: No cranial nerve deficit.     Motor: No abnormal muscle tone.  Psychiatric:        Behavior: Behavior normal.     ------------------------------------------------------------------------------------------------------------------------------------------------------------------------------------------------------------------- Assessment and Plan  Well  adult exam Well adult Orders Placed This Encounter  Procedures  . TSH  . T3, free  . TSH + free T4  . T3, reverse  . Thyroid Peroxidase Antibodies (TPO) (REFL)  . Thyroglobulin Antibodies (Refl)  . Iodine, Random Urine  . COMPLETE METABOLIC PANEL WITH GFR  . CBC  . Lipid Profile  . HgB A1c  . Vitamin D (25 hydroxy)  . T4, free  Labs ordered for assessment of thyroid per his request, discussed that insurance may not cover all of these labs.   Screening: He has colonoscopy scheduled Immunizations: UTD Anticipatory guidance/Risk factor reduction:  Recommend continuation of healthy habits. Additional recommendations per AVS   No orders of the defined types  were placed in this encounter.   No follow-ups on file.    This visit occurred during the SARS-CoV-2 public health emergency.  Safety protocols were in place, including screening questions prior to the visit, additional usage of staff PPE, and extensive cleaning of exam room while observing appropriate contact time as indicated for disinfecting solutions.

## 2020-06-23 NOTE — Assessment & Plan Note (Signed)
Well adult Orders Placed This Encounter  Procedures  . TSH  . T3, free  . TSH + free T4  . T3, reverse  . Thyroid Peroxidase Antibodies (TPO) (REFL)  . Thyroglobulin Antibodies (Refl)  . Iodine, Random Urine  . COMPLETE METABOLIC PANEL WITH GFR  . CBC  . Lipid Profile  . HgB A1c  . Vitamin D (25 hydroxy)  . T4, free  Labs ordered for assessment of thyroid per his request, discussed that insurance may not cover all of these labs.   Screening: He has colonoscopy scheduled Immunizations: UTD Anticipatory guidance/Risk factor reduction:  Recommend continuation of healthy habits. Additional recommendations per AVS

## 2020-06-24 LAB — LIPID PANEL
Cholesterol: 226 mg/dL — ABNORMAL HIGH (ref <200)
HDL: 40 mg/dL (ref >=40)
LDL Cholesterol (Calc): 157 mg/dL (calc) — ABNORMAL HIGH
Non-HDL Cholesterol (Calc): 186 mg/dL (calc) — ABNORMAL HIGH (ref <130)
Total CHOL/HDL Ratio: 5.7 (calc) — ABNORMAL HIGH (ref <5.0)
Triglycerides: 159 mg/dL — ABNORMAL HIGH (ref <150)

## 2020-06-24 LAB — COMPLETE METABOLIC PANEL WITH GFR
AG Ratio: 2 (calc) (ref 1.0–2.5)
ALT: 29 U/L (ref 9–46)
AST: 28 U/L (ref 10–35)
Albumin: 4.9 g/dL (ref 3.6–5.1)
Alkaline phosphatase (APISO): 58 U/L (ref 35–144)
BUN: 14 mg/dL (ref 7–25)
CO2: 30 mmol/L (ref 20–32)
Calcium: 9.8 mg/dL (ref 8.6–10.3)
Chloride: 100 mmol/L (ref 98–110)
Creat: 0.98 mg/dL (ref 0.70–1.33)
GFR, Est African American: 104 mL/min/{1.73_m2} (ref >=60)
GFR, Est Non African American: 90 mL/min/{1.73_m2} (ref >=60)
Globulin: 2.4 g/dL (calc) (ref 1.9–3.7)
Glucose, Bld: 100 mg/dL — ABNORMAL HIGH (ref 65–99)
Potassium: 4.4 mmol/L (ref 3.5–5.3)
Sodium: 139 mmol/L (ref 135–146)
Total Bilirubin: 0.6 mg/dL (ref 0.2–1.2)
Total Protein: 7.3 g/dL (ref 6.1–8.1)

## 2020-06-24 LAB — T3, REVERSE: T3, Reverse: 12 ng/dL (ref 8–25)

## 2020-06-24 LAB — CBC
HCT: 44.2 % (ref 38.5–50.0)
Hemoglobin: 15 g/dL (ref 13.2–17.1)
MCH: 31.1 pg (ref 27.0–33.0)
MCHC: 33.9 g/dL (ref 32.0–36.0)
MCV: 91.5 fL (ref 80.0–100.0)
MPV: 11.1 fL (ref 7.5–12.5)
Platelets: 239 10*3/uL (ref 140–400)
RBC: 4.83 10*6/uL (ref 4.20–5.80)
RDW: 12 % (ref 11.0–15.0)
WBC: 4.7 10*3/uL (ref 3.8–10.8)

## 2020-06-24 LAB — HEMOGLOBIN A1C
Hgb A1c MFr Bld: 5.3 % of total Hgb (ref <5.7)
Mean Plasma Glucose: 105 (calc)
eAG (mmol/L): 5.8 (calc)

## 2020-06-24 LAB — THYROID PEROXIDASE ANTIBODIES (TPO) (REFL): Thyroperoxidase Ab SerPl-aCnc: 154 IU/mL — ABNORMAL HIGH (ref <9)

## 2020-06-24 LAB — IODINE, RANDOM URINE: Iodine, Random Urine: 1188 mcg/L — ABNORMAL HIGH (ref 34–523)

## 2020-06-24 LAB — T4, FREE: Free T4: 1.1 ng/dL (ref 0.8–1.8)

## 2020-06-24 LAB — T3, FREE: T3, Free: 3.6 pg/mL (ref 2.3–4.2)

## 2020-06-24 LAB — TSH+FREE T4: TSH W/REFLEX TO FT4: 6.31 mIU/L — ABNORMAL HIGH (ref 0.40–4.50)

## 2020-06-24 LAB — VITAMIN D 25 HYDROXY (VIT D DEFICIENCY, FRACTURES): Vit D, 25-Hydroxy: 69 ng/mL (ref 30–100)

## 2020-06-24 LAB — THYROGLOBULIN ANTIBODIES (REFL): Thyroglobulin Ab: <1 IU/mL (ref <=1)

## 2020-06-27 ENCOUNTER — Encounter: Payer: Self-pay | Admitting: Family Medicine

## 2020-06-27 NOTE — Telephone Encounter (Signed)
Routing to covering provider.  °

## 2020-07-26 ENCOUNTER — Encounter: Payer: Self-pay | Admitting: Family Medicine

## 2020-07-26 DIAGNOSIS — E039 Hypothyroidism, unspecified: Secondary | ICD-10-CM

## 2020-07-26 DIAGNOSIS — E038 Other specified hypothyroidism: Secondary | ICD-10-CM

## 2020-07-26 DIAGNOSIS — K588 Other irritable bowel syndrome: Secondary | ICD-10-CM

## 2020-07-26 LAB — HM COLONOSCOPY

## 2020-07-26 NOTE — Telephone Encounter (Signed)
Last refilled January 2019. RX pended.

## 2020-07-27 MED ORDER — DICYCLOMINE HCL 20 MG PO TABS: 20.0000 mg | ORAL_TABLET | Freq: Four times a day (QID) | ORAL | 1 refills | Status: DC

## 2020-07-27 NOTE — Telephone Encounter (Signed)
Med refilled. Please schedule him for f/u labs results.

## 2020-07-29 NOTE — Telephone Encounter (Addendum)
Repeat labs have been ordered. Please get Robert Aguilar on schedule to discuss these results with Dr Linford Arnold after at least a day after labs are drawn

## 2020-07-29 NOTE — Telephone Encounter (Signed)
Attempted to call PT. Left voicemail.

## 2020-08-20 ENCOUNTER — Ambulatory Visit (INDEPENDENT_AMBULATORY_CARE_PROVIDER_SITE_OTHER): Payer: Managed Care, Other (non HMO) | Admitting: Family Medicine

## 2020-08-20 ENCOUNTER — Encounter: Payer: Self-pay | Admitting: Family Medicine

## 2020-08-20 ENCOUNTER — Other Ambulatory Visit: Payer: Self-pay

## 2020-08-20 VITALS — BP 116/66 | HR 80 | Ht 67.0 in | Wt 180.0 lb

## 2020-08-20 DIAGNOSIS — E039 Hypothyroidism, unspecified: Secondary | ICD-10-CM | POA: Diagnosis not present

## 2020-08-20 DIAGNOSIS — I10 Essential (primary) hypertension: Secondary | ICD-10-CM | POA: Diagnosis not present

## 2020-08-20 DIAGNOSIS — F419 Anxiety disorder, unspecified: Secondary | ICD-10-CM

## 2020-08-20 DIAGNOSIS — F329 Major depressive disorder, single episode, unspecified: Secondary | ICD-10-CM

## 2020-08-20 DIAGNOSIS — E038 Other specified hypothyroidism: Secondary | ICD-10-CM

## 2020-08-20 DIAGNOSIS — E785 Hyperlipidemia, unspecified: Secondary | ICD-10-CM

## 2020-08-20 DIAGNOSIS — F32A Depression, unspecified: Secondary | ICD-10-CM

## 2020-08-20 MED ORDER — LOSARTAN POTASSIUM 100 MG PO TABS
100.0000 mg | ORAL_TABLET | Freq: Every day | ORAL | 0 refills | Status: DC
Start: 2020-08-20 — End: 2021-02-24

## 2020-08-20 NOTE — Progress Notes (Signed)
Established Patient Office Visit  Subjective:  Patient ID: Robert Aguilar, male    DOB: Aug 05, 1971  Age: 49 y.o. MRN: [id number]  CC:  Chief Complaint  Patient presents with  . Hypertension    HPI TRAYQUAN KOLAKOWSKI presents for follow-up hypertension-when I last saw him in June he wanted to switch lisinopril back to losartan which she has done.  Reports home blood pressures have been well controlled.  In fact his blood pressures have been a little bit low has had a couple episodes of feeling a little lightheaded and dizzy especially with position change he says the lowest blood pressure he is measured at home was 106/72 but most of them are typically running less than 120.  Follow-up depression/anxiety-he was initially started on Lexapro.  Back in June he generally been on on it for about a week when I saw him.  He had his colonoscopy be completed on August 13.  Follow-up subclinical hypothyroidism-he did try the thyroid supplement and says he really did not notice any difference in how he was feeling so eventually just stopped it.  He recently had thyroid labs.  He said he went to a naturopathic doctor and was placed on a supplement but then his iodine levels skyrocketed so he actually stopped the supplement.  The 10-year ASCVD risk score Denman George DC Montez Hageman., et al., 2013) is: 5.1%   Values used to calculate the score:     Age: 58 years     Sex: Male     Is Non-Hispanic African American: No     Diabetic: No     Tobacco smoker: No     Systolic Blood Pressure: 116 mmHg     Is BP treated: Yes     HDL Cholesterol: 40 mg/dL     Total Cholesterol: 226 mg/dL     Past Medical History:  Diagnosis Date  . Hypertension   . Laceration of right upper arm 09/23/2016    Past Surgical History:  Procedure Laterality Date  . SHOULDER SURGERY     LT    Family History  Problem Relation Age of Onset  . Hypertension Other   . Hyperlipidemia Other   . Heart attack Brother 54  . Colon cancer  Other        grandmother, 2 aunts  . Coronary artery disease Other        family history  . Hypertension Mother   . Hyperlipidemia Mother   . Heart disease Mother        heart attack  . Heart failure Mother   . Thyroid disease Mother   . Hypertension Father   . Hyperlipidemia Father   . Heart disease Father        heart attack  . Heart failure Father   . Diabetes Brother   . Hypertension Brother     Social History   Socioeconomic History  . Marital status: Married    Spouse name: Not on file  . Number of children: Not on file  . Years of education: Not on file  . Highest education level: Not on file  Occupational History  . Not on file  Tobacco Use  . Smoking status: Former Games developer  . Smokeless tobacco: Never Used  Substance and Sexual Activity  . Alcohol use: No  . Drug use: No  . 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  Other Topics  Concern  . Not on file  Social History Narrative  . Not on file   Social Determinants of Health   Financial Resource Strain:   . Difficulty of Paying Living Expenses: Not on file  Food Insecurity:   . Worried About Programme researcher, broadcasting/film/video in the Last Year: Not on file  . Ran Out of Food in the Last Year: Not on file  Transportation Needs:   . Lack of Transportation (Medical): Not on file  . Lack of Transportation (Non-Medical): Not on file  Physical Activity:   . Days of Exercise per Week: Not on file  . Minutes of Exercise per Session: Not on file  Stress:   . Feeling of Stress : Not on file  Social Connections:   . Frequency of Communication with Friends and Family: Not on file  . Frequency of Social Gatherings with Friends and Family: Not on file  . Attends Religious Services: Not on file  . Active Member of Clubs or Organizations: Not on file  . Attends Banker Meetings: Not on file  . Marital Status: Not on file   Intimate Partner Violence:   . Fear of Current or Ex-Partner: Not on file  . Emotionally Abused: Not on file  . Physically Abused: Not on file  . Sexually Abused: Not on file    Outpatient Medications Prior to Visit  Medication Sig Dispense Refill  . dicyclomine (BENTYL) 20 MG tablet Take 1 tablet (20 mg total) by mouth every 6 (six) hours. 360 tablet 1  . hydrochlorothiazide (HYDRODIURIL) 25 MG tablet Take 1 tablet (25 mg total) by mouth daily. 90 tablet 1  . Multiple Vitamins-Minerals (MULTIVITAMIN WITH MINERALS) tablet Take 1 tablet by mouth daily.    . traZODone (DESYREL) 50 MG tablet TAKE 0.5 1 TABLETS (25 50 MG TOTAL) BY MOUTH AT BEDTIME AS NEEDED FOR SLEEP. 30 tablet 3  . losartan (COZAAR) 100 MG tablet Take 1 tablet (100 mg total) by mouth daily. 90 tablet 0   No facility-administered medications prior to visit.    Allergies  Allergen Reactions  . Adderall [Amphetamine-Dextroamphetamine] Other (See Comments)    Increase BP  . Amitriptyline Other (See Comments)    Excess Sedation  . Lexapro [Escitalopram] Other (See Comments)    Constipation  . Sertraline Other (See Comments)    Dizziness    ROS Review of Systems    Objective:    Physical Exam Constitutional:      Appearance: He is well-developed.  HENT:     Head: Normocephalic and atraumatic.  Cardiovascular:     Rate and Rhythm: Normal rate and regular rhythm.     Heart sounds: Normal heart sounds.  Pulmonary:     Effort: Pulmonary effort is normal.     Breath sounds: Normal breath sounds.  Skin:    General: Skin is warm and dry.  Neurological:     Mental Status: He is alert and oriented to person, place, and time.  Psychiatric:        Behavior: Behavior normal.     BP 116/66   Pulse 80   Ht 5\' 7"  (1.702 m)   Wt 180 lb (81.6 kg)   SpO2 98%   BMI 28.19 kg/m  Wt Readings from Last 3 Encounters:  08/20/20 180 lb (81.6 kg)  06/21/20 186 lb 4.8 oz (84.5 kg)  06/13/19 194 lb (88 kg)     There  are no preventive care reminders to display for this patient.  There are no preventive care reminders to display for this patient.  Lab Results  Component Value Date   TSH 2.69 06/05/2019   Lab Results  Component Value Date   WBC 4.7 06/21/2020   HGB 15.0 06/21/2020   HCT 44.2 06/21/2020   MCV 91.5 06/21/2020   PLT 239 06/21/2020   Lab Results  Component Value Date   NA 139 06/21/2020   K 4.4 06/21/2020   CO2 30 06/21/2020   GLUCOSE 100 (H) 06/21/2020   BUN 14 06/21/2020   CREATININE 0.98 06/21/2020   BILITOT 0.6 06/21/2020   ALKPHOS 69 02/28/2015   AST 28 06/21/2020   ALT 29 06/21/2020   PROT 7.3 06/21/2020   ALBUMIN 4.2 02/28/2015   CALCIUM 9.8 06/21/2020   Lab Results  Component Value Date   CHOL 226 (H) 06/21/2020   Lab Results  Component Value Date   HDL 40 06/21/2020   Lab Results  Component Value Date   LDLCALC 157 (H) 06/21/2020   Lab Results  Component Value Date   TRIG 159 (H) 06/21/2020   Lab Results  Component Value Date   CHOLHDL 5.7 (H) 06/21/2020   Lab Results  Component Value Date   HGBA1C 5.3 06/21/2020      Assessment & Plan:   Problem List Items Addressed This Visit      Cardiovascular and Mediastinum   Essential hypertension, benign    Well controlled. In fact BPs have been less than 120s.  Okay to cut losartan in half and monitor blood pressures at home.  Ideally they do need to be under 130.   Follow up in  6 mo      Relevant Medications   losartan (COZAAR) 100 MG tablet     Endocrine   Subclinical hypothyroidism - Primary    No change in symptoms on or off of levothyroxine.  Did recommend taking a selenium supplement to help reduce thyroid antibodies.  We can always recheck those levels again in 2 to 3 months.      Relevant Orders   TSH   Thyroid peroxidase antibody     Other   Hyperlipidemia    To significant dietary change, weight loss and routine exercise I suspect his lipids are probably improved  significantly.  He wants to continue to work at these changes for couple more months and then at that time we will recheck everything.      Relevant Medications   losartan (COZAAR) 100 MG tablet   Other Relevant Orders   Lipid panel   Anxiety and depression    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          Meds ordered this encounter  Medications  . losartan (COZAAR) 100 MG tablet    Sig: Take 1 tablet (100 mg total) by mouth daily.    Dispense:  90 tablet    Refill:  0    Follow-up: Return in about 4 months (around 12/20/2020) for Hypertension.    Nani Gasser, MD

## 2020-08-20 NOTE — Patient Instructions (Addendum)
Ok to cut your losartan in half.  Plant to recheck cholesterol and thyroid in about 2-3 months.

## 2020-08-20 NOTE — Assessment & Plan Note (Signed)
To significant dietary change, weight loss and routine exercise I suspect his lipids are probably improved significantly.  He wants to continue to work at these changes for couple more months and then at that time we will recheck everything.

## 2020-08-20 NOTE — Assessment & Plan Note (Signed)
No change in symptoms on or off of levothyroxine.  Did recommend taking a selenium supplement to help reduce thyroid antibodies.  We can always recheck those levels again in 2 to 3 months.

## 2020-08-20 NOTE — Assessment & Plan Note (Signed)
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

## 2020-08-20 NOTE — Assessment & Plan Note (Addendum)
Well controlled. In fact BPs have been less than 120s.  Okay to cut losartan in half and monitor blood pressures at home.  Ideally they do need to be under 130.   Follow up in  6 mo

## 2020-10-15 ENCOUNTER — Encounter: Payer: Self-pay | Admitting: Nurse Practitioner

## 2020-10-15 ENCOUNTER — Telehealth (INDEPENDENT_AMBULATORY_CARE_PROVIDER_SITE_OTHER): Payer: Managed Care, Other (non HMO) | Admitting: Nurse Practitioner

## 2020-10-15 VITALS — BP 116/72 | HR 84 | Temp 98.0°F

## 2020-10-15 DIAGNOSIS — M549 Dorsalgia, unspecified: Secondary | ICD-10-CM | POA: Diagnosis not present

## 2020-10-15 MED ORDER — PREDNISONE 50 MG PO TABS: 50.0000 mg | ORAL_TABLET | Freq: Every day | ORAL | 0 refills | Status: AC

## 2020-10-15 MED ORDER — CYCLOBENZAPRINE HCL 10 MG PO TABS: 5.0000 mg | ORAL_TABLET | Freq: Three times a day (TID) | ORAL | 1 refills | Status: DC | PRN

## 2020-10-15 NOTE — Patient Instructions (Addendum)
You can continue to use ice for 20 minutes a at a time a couple times a day.   You can also use heat with a heating pad to the area for 20 minutes at a time a couple times a day.   I have included some gentle stretching exercises below that you can perform to see if these help loosen the muscles in your upper back.  Other options that may be helpful include warm soaks/baths, gentle massage, yoga, and muscle rubs (IcyHot/Tiger Balm, etc), Diclofenac gel may also be helpful in place of oral NSAIDs (ibuprofen/meloxicam, etc).   If you have a TENS unit, this may be helpful as well- these can be purchased on [facility] for about $25. They stimulate muscle contraction which can help eliminate some of the spacticity with use.    Do not take Ibuprofen while you are taking the prednisone as this can increase the chance of GI disturbance.   I have sent in a prescription for a 5 day burst of prednisone- take this first thing in the morning with food to help prevent sleep disruption and upset stomach.   I have also sent in a prescription for flexeril. You can take 0.5-1 tab up to three times a day to help with spasms. If this makes you too sleepy, you can just use it at night to help relax the muscles and try to stop the spasms.   If you are not feeling any better by the end of the week, please let me know and we can consider further evaluation.   Back Exercises The following exercises strengthen the muscles that help to support the trunk and back. They also help to keep the lower back flexible. Doing these exercises can help to prevent back pain or lessen existing pain.  If you have back pain or discomfort, try doing these exercises 2-3 times each day or as told by your health care provider.  As your pain improves, do them once each day, but increase the number of times that you repeat the steps for each exercise (do more repetitions).  To prevent the recurrence of back pain, continue to do these  exercises once each day or as told by your health care provider. Do exercises exactly as told by your health care provider and adjust them as directed. It is normal to feel mild stretching, pulling, tightness, or discomfort as you do these exercises, but you should stop right away if you feel sudden pain or your pain gets worse. Exercises Single knee to chest Repeat these steps 3-5 times for each leg: 1. Lie on your back on a firm bed or the floor with your legs extended. 2. Bring one knee to your chest. Your other leg should stay extended and in contact with the floor. 3. Hold your knee in place by grabbing your knee or thigh with both hands and hold. 4. Pull on your knee until you feel a gentle stretch in your lower back or buttocks. 5. Hold the stretch for 10-30 seconds. 6. Slowly release and straighten your leg. Pelvic tilt Repeat these steps 5-10 times: 1. Lie on your back on a firm bed or the floor with your legs extended. 2. Bend your knees so they are pointing toward the ceiling and your feet are flat on the floor. 3. Tighten your lower abdominal muscles to press your lower back against the floor. This motion will tilt your pelvis so your tailbone points up toward the ceiling instead of pointing  to your feet or the floor. 4. With gentle tension and even breathing, hold this position for 5-10 seconds. Cat-cow Repeat these steps until your lower back becomes more flexible: 1. Get into a hands-and-knees position on a firm surface. Keep your hands under your shoulders, and keep your knees under your hips. You may place padding under your knees for comfort. 2. Let your head hang down toward your chest. Contract your abdominal muscles and point your tailbone toward the floor so your lower back becomes rounded like the back of a cat. 3. Hold this position for 5 seconds. 4. Slowly lift your head, let your abdominal muscles relax and point your tailbone up toward the ceiling so your back forms a  sagging arch like the back of a cow. 5. Hold this position for 5 seconds.  Press-ups Repeat these steps 5-10 times: 1. Lie on your abdomen (face-down) on the floor. 2. Place your palms near your head, about shoulder-width apart. 3. Keeping your back as relaxed as possible and keeping your hips on the floor, slowly straighten your arms to raise the top half of your body and lift your shoulders. Do not use your back muscles to raise your upper torso. You may adjust the placement of your hands to make yourself more comfortable. 4. Hold this position for 5 seconds while you keep your back relaxed. 5. Slowly return to lying flat on the floor.  Bridges Repeat these steps 10 times: 1. Lie on your back on a firm surface. 2. Bend your knees so they are pointing toward the ceiling and your feet are flat on the floor. Your arms should be flat at your sides, next to your body. 3. Tighten your buttocks muscles and lift your buttocks off the floor until your waist is at almost the same height as your knees. You should feel the muscles working in your buttocks and the back of your thighs. If you do not feel these muscles, slide your feet 1-2 inches farther away from your buttocks. 4. Hold this position for 3-5 seconds. 5. Slowly lower your hips to the starting position, and allow your buttocks muscles to relax completely. If this exercise is too easy, try doing it with your arms crossed over your chest. Abdominal crunches Repeat these steps 5-10 times: 1. Lie on your back on a firm bed or the floor with your legs extended. 2. Bend your knees so they are pointing toward the ceiling and your feet are flat on the floor. 3. Cross your arms over your chest. 4. Tip your chin slightly toward your chest without bending your neck. 5. Tighten your abdominal muscles and slowly raise your trunk (torso) high enough to lift your shoulder blades a tiny bit off the floor. Avoid raising your torso higher than that because  it can put too much stress on your low back and does not help to strengthen your abdominal muscles. 6. Slowly return to your starting position. Back lifts Repeat these steps 5-10 times: 1. Lie on your abdomen (face-down) with your arms at your sides, and rest your forehead on the floor. 2. Tighten the muscles in your legs and your buttocks. 3. Slowly lift your chest off the floor while you keep your hips pressed to the floor. Keep the back of your head in line with the curve in your back. Your eyes should be looking at the floor. 4. Hold this position for 3-5 seconds. 5. Slowly return to your starting position. Contact a health care provider  if:  Your back pain or discomfort gets much worse when you do an exercise.  Your worsening back pain or discomfort does not lessen within 2 hours after you exercise. If you have any of these problems, stop doing these exercises right away. Do not do them again unless your health care provider says that you can. Get help right away if:  You develop sudden, severe back pain. If this happens, stop doing the exercises right away. Do not do them again unless your health care provider says that you can. This information is not intended to replace advice given to you by your health care provider. Make sure you discuss any questions you have with your health care provider. Document Revised: 04/06/2019 Document Reviewed: 09/01/2018 Elsevier Patient Education  2020 [employer].

## 2020-10-15 NOTE — Progress Notes (Signed)
Virtual Video Visit via MyChart Note  I connected with  Robert Aguilar on 10/15/20 at  1:10 PM EDT by the video enabled telemedicine application for , MyChart, and verified that I am speaking with the correct person using two identifiers.   I introduced myself as a Publishing rights manager with the practice. We discussed the limitations of evaluation and management by telemedicine and the availability of in person appointments. The patient expressed understanding and agreed to proceed.  The patient is: at home I am: in the office  Subjective:    CC:  Chief Complaint  Patient presents with  . Back Pain    mid-upper back pain for last 6 weeks, started an exercise program 2 months ago, feels this is due to the increased exercise, feels muscular    HPI: Robert Aguilar is a 49 y.o. y/o male presenting via MyChart today for back pain located between his shoulder blades that has been ongoing for about 6 weeks.   He reports that about 4 months ago he started an exercise program that consists of light weights and cycling. He tells me that when he first noticed the muscle pain he did go to the chiropractor and received an adjustment. He reports the adjustment did help with the alignment, but the residual discomfort feels muscular in nature.   He describes the muscles as feeling "tight" and he is experiencing the sensation that his mid/upper back muscles are being pulled back. He reports the pain is about a 4-5/10 at baseline and as the day progresses the pain increases to a 7/10. He also reports that he has developed headaches due to the tightness in his upper back and shoulders. He reports the headaches have improved since his adjustment.   He has been taking minimal ibuprofen to help with the pain- he has a history of GI bleed due to increased NSAID use and tries to avoid this if possible. He has also been doing gentle stretching and yoga, icing the back twice a day, and has been avoiding exercise  activities which seem to exacerbate the pain.   Past medical history, Surgical history, Family history not pertinant except as noted below, Social history, Allergies, and medications have been entered into the medical record, reviewed, and corrections made.   Review of Systems:  See HPI for pertinent positive and negatives  Objective:    General: Speaking clearly in complete sentences without any shortness of breath.   Alert and oriented x3.   Normal judgment.  No apparent acute distress.   Impression and Recommendations:   1. Other acute back pain Symptoms and presentation consistent with acute muscular spasticity associated with possible overuse or posture related to exercise activities. Recommend gentle stretching exercises, continue yoga, heat to the area 20 minutes at a time at least twice a day. Patient has responded well in the past to prednisone with similar muscle spasms.  Will send in a 50 mg prednisone burst for 5 days.  Also provided prescription for cyclobenzaprine to be utilized as needed.  Patient aware of sedative effects but this medication may have. Suggestions for additional interventions include warm/soaks, gentle massage, use of a muscle rub, and/or diclofenac gel to the affected area.  TENS unit may also be helpful for pain.  Recommend monitoring posture and avoiding heavy lifting and exercises that may exacerbate symptoms.  Follow-up if symptoms have not improved by the end of the week- formal physical therapy may be needed if symptoms persist.  - predniSONE (DELTASONE)  50 MG tablet; Take 1 tablet (50 mg total) by mouth daily with breakfast for 5 days.  Dispense: 5 tablet; Refill: 0 - cyclobenzaprine (FLEXERIL) 10 MG tablet; Take 0.5-1 tablets (5-10 mg total) by mouth 3 (three) times daily as needed for muscle spasms. Caution: can cause drowsiness  Dispense: 20 tablet; Refill: 1    I discussed the assessment and treatment plan with the patient. The patient was  provided an opportunity to ask questions and all were answered. The patient agreed with the plan and demonstrated an understanding of the instructions.   The patient was advised to call back or seek an in-person evaluation if the symptoms worsen or if the condition fails to improve as anticipated.  I provided 20 minutes of non-face-to-face interaction with this MYCHART visit including intake, same-day documentation, and chart review.   Tollie Eth, NP

## 2020-10-22 LAB — HM COLONOSCOPY

## 2020-10-30 ENCOUNTER — Encounter: Payer: Self-pay | Admitting: Nurse Practitioner

## 2020-11-01 ENCOUNTER — Ambulatory Visit: Payer: Managed Care, Other (non HMO) | Admitting: Rehabilitative and Restorative Service Providers"

## 2020-12-08 ENCOUNTER — Encounter: Payer: Self-pay | Admitting: Family Medicine

## 2020-12-10 ENCOUNTER — Ambulatory Visit (INDEPENDENT_AMBULATORY_CARE_PROVIDER_SITE_OTHER): Payer: Managed Care, Other (non HMO)

## 2020-12-10 ENCOUNTER — Other Ambulatory Visit: Payer: Self-pay

## 2020-12-10 ENCOUNTER — Ambulatory Visit: Payer: Managed Care, Other (non HMO) | Admitting: Sports Medicine

## 2020-12-10 DIAGNOSIS — M5412 Radiculopathy, cervical region: Secondary | ICD-10-CM | POA: Diagnosis not present

## 2020-12-10 DIAGNOSIS — G8929 Other chronic pain: Secondary | ICD-10-CM

## 2020-12-10 NOTE — Assessment & Plan Note (Signed)
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 He has had greater than 6 weeks of conservative measures including formal physical therapy, chiropractic manipulation, steroids. Because of this we will proceed now with x-rays and an MRI. This will be for interventional planning. Return to see me to go over MRI results. He does need his MRI done at a [facility] facility.

## 2020-12-10 NOTE — Progress Notes (Signed)
    Procedures performed today:    None.  Independent interpretation of notes and tests performed by another provider:   None.  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 He has had greater than 6 weeks of conservative measures including formal physical therapy, chiropractic manipulation, steroids. Because of this we will proceed now with x-rays and an MRI. This will be for interventional planning. Return to see me to go over MRI results. He does need his MRI done at a [facility] facility.    ___________________________________________ Ihor Austin. Benjamin Stain, M.D., ABFM., CAQSM. Primary Care and Sports Medicine [facility]  Adjunct Instructor of Family Medicine  [employer]

## 2020-12-11 ENCOUNTER — Ambulatory Visit (INDEPENDENT_AMBULATORY_CARE_PROVIDER_SITE_OTHER): Payer: Managed Care, Other (non HMO)

## 2020-12-11 DIAGNOSIS — M4134 Thoracogenic scoliosis, thoracic region: Secondary | ICD-10-CM | POA: Diagnosis not present

## 2020-12-11 DIAGNOSIS — M545 Low back pain, unspecified: Secondary | ICD-10-CM

## 2020-12-11 DIAGNOSIS — G8929 Other chronic pain: Secondary | ICD-10-CM

## 2020-12-11 NOTE — Assessment & Plan Note (Signed)
Increasing thoracic back pain in spite of greater than 3 months of conservative treatment, x-rays obtained, need MRI for interventional planning.

## 2020-12-17 ENCOUNTER — Telehealth: Payer: Self-pay | Admitting: Family Medicine

## 2020-12-17 NOTE — Telephone Encounter (Signed)
Patient dropped off a copy of his MRI for Dr T on 12/17/2020, placed in Dr Melvia Heaps box. AM

## 2020-12-19 ENCOUNTER — Telehealth: Payer: Self-pay

## 2020-12-19 NOTE — Telephone Encounter (Signed)
Patient dropped off MRI disk 12/19/2020. Disk was placed in Dr.T's box.

## 2020-12-23 ENCOUNTER — Other Ambulatory Visit: Payer: Self-pay

## 2020-12-23 ENCOUNTER — Ambulatory Visit: Payer: Managed Care, Other (non HMO) | Admitting: Sports Medicine

## 2020-12-23 DIAGNOSIS — M5412 Radiculopathy, cervical region: Secondary | ICD-10-CM

## 2020-12-23 NOTE — Assessment & Plan Note (Signed)
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 Laurian Brim is out. 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

## 2020-12-23 NOTE — Progress Notes (Signed)
    Procedures performed today:    None.  Independent interpretation of notes and tests performed by another provider:   Cervical spine MRI personally reviewed, there is a mild to moderate C5-C6 disc protrusion.  Thoracic spine MRI was essentially unremarkable.  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 Laurian Brim is out. 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    ___________________________________________ Robert Aguilar. Robert Aguilar, M.D., ABFM., CAQSM. Primary Care and Sports Medicine [facility]  Adjunct Instructor of Family Medicine  [employer]

## 2020-12-30 ENCOUNTER — Encounter: Payer: Self-pay | Admitting: Family Medicine

## 2021-01-05 ENCOUNTER — Other Ambulatory Visit: Payer: Self-pay | Admitting: Family Medicine

## 2021-02-24 ENCOUNTER — Other Ambulatory Visit: Payer: Self-pay | Admitting: Family Medicine

## 2021-05-05 ENCOUNTER — Encounter: Payer: Self-pay | Admitting: Family Medicine

## 2021-05-05 ENCOUNTER — Telehealth: Payer: Self-pay | Admitting: General Practice

## 2021-05-05 NOTE — Telephone Encounter (Signed)
Transition Care Management Unsuccessful Follow-up Telephone Call  Date of discharge and from where:  [facility] 05/04/21  Attempts:  1st Attempt  Reason for unsuccessful TCM follow-up call:  Left voice message

## 2021-05-06 ENCOUNTER — Telehealth: Payer: Self-pay | Admitting: General Practice

## 2021-05-06 NOTE — Telephone Encounter (Signed)
Transition Care Management Unsuccessful Follow-up Telephone Call  Date of discharge and from where:  [facility] 05/04/21  Attempts:  2nd Attempt  Reason for unsuccessful TCM follow-up call:  Left voice message

## 2021-05-06 NOTE — Telephone Encounter (Addendum)
Transition Care Management Unsuccessful Follow-up Telephone Call  Date of discharge and from where:  [facility] 05/05/21  Attempts:  1st Attempt  Reason for unsuccessful TCM follow-up call:  Left voice message    

## 2021-05-07 NOTE — Telephone Encounter (Signed)
Transition Care Management Unsuccessful Follow-up Telephone Call  Date of discharge and from where:  [facility] 05/04/21   Attempts:  3rd Attempt  Reason for unsuccessful TCM follow-up call:  No answer/busy Patient has an OV scheduled for 05/08/21 with Dr. Linford Arnold.

## 2021-05-07 NOTE — Telephone Encounter (Signed)
   Transition Care Management Unsuccessful Follow-up Telephone Call  Date of discharge and from where:  [facility] 05/04/21  Attempts:  2nd Attempt  Reason for unsuccessful TCM follow-up call:  No answer/busy Patient has an OV scheduled for 05/08/21 with Dr. Linford Arnold.

## 2021-05-08 ENCOUNTER — Other Ambulatory Visit: Payer: Self-pay

## 2021-05-08 ENCOUNTER — Ambulatory Visit: Payer: Managed Care, Other (non HMO) | Admitting: Family Medicine

## 2021-05-08 ENCOUNTER — Encounter: Payer: Self-pay | Admitting: Family Medicine

## 2021-05-08 VITALS — BP 140/80 | HR 81 | Ht 67.0 in | Wt 177.0 lb

## 2021-05-08 DIAGNOSIS — I10 Essential (primary) hypertension: Secondary | ICD-10-CM

## 2021-05-08 DIAGNOSIS — R232 Flushing: Secondary | ICD-10-CM | POA: Diagnosis not present

## 2021-05-08 DIAGNOSIS — R002 Palpitations: Secondary | ICD-10-CM | POA: Diagnosis not present

## 2021-05-08 DIAGNOSIS — E871 Hypo-osmolality and hyponatremia: Secondary | ICD-10-CM

## 2021-05-08 DIAGNOSIS — E785 Hyperlipidemia, unspecified: Secondary | ICD-10-CM

## 2021-05-08 DIAGNOSIS — F4329 Adjustment disorder with other symptoms: Secondary | ICD-10-CM

## 2021-05-08 MED ORDER — ESCITALOPRAM OXALATE 10 MG PO TABS: ORAL_TABLET | ORAL | 0 refills | Status: DC

## 2021-05-08 MED ORDER — CLONAZEPAM 0.5 MG PO TABS: 0.5000 mg | ORAL_TABLET | Freq: Two times a day (BID) | ORAL | 0 refills | Status: DC | PRN

## 2021-05-08 NOTE — Progress Notes (Signed)
Established Patient Office Visit  Subjective:  Patient ID: Robert Aguilar, male    DOB: 1971/11/23  Age: 50 y.o. MRN: [id number]  CC:  Chief Complaint  Patient presents with  . Hospitalization Follow-up    HPI Robert Aguilar presents for hospital/ED follow-up  Follow-up of recent chest pain.  He says he has had a few episodes of feeling almost like he is getting flushed or hot it starts at his head then goes down to his arms and into his legs and then starts to feel his heart jumping and starting to race.  The hot flash and the heart sensation start within seconds of each other.  He has felt nauseated with it.  The highest heart rate that has had during this sensation is 110.  This last time he went to the emergency department and even while feeling it his pulse only was in the 80s he was able to think about some stressful things and recreate the sensation.  He has been under a little bit more stress recently.  He said he had an injection in his back on March 31 and about a week later feels like he started to feel the Kenalog kicking it was making him feel just a little bit more off or anxious.  Then he was not sleeping well and then his wife started having some heart problems where she ended up having a heart attack secondary to SCAD.Marland Kitchen  He has been on anxiety medicines in the past he has not been on anything in quite some time he feels like he did best with Lexapro in the past it does cause constipation.  Past Medical History:  Diagnosis Date  . Hypertension   . Laceration of right upper arm 09/23/2016    Past Surgical History:  Procedure Laterality Date  . SHOULDER SURGERY     LT    Family History  Problem Relation Age of Onset  . Hypertension Other   . Hyperlipidemia Other   . Heart attack Brother 54  . Colon cancer Other        grandmother, 2 aunts  . Coronary artery disease Other        family history  . Hypertension Mother   . Hyperlipidemia Mother   . Heart disease  Mother        heart attack  . Heart failure Mother   . Thyroid disease Mother   . Hypertension Father   . Hyperlipidemia Father   . Heart disease Father        heart attack  . Heart failure Father   . Diabetes Brother   . Hypertension Brother     Social History   Socioeconomic History  . Marital status: Married    Spouse name: Not on file  . Number of children: Not on file  . Years of education: Not on file  . Highest education level: Not on file  Occupational History  . Not on file  Tobacco Use  . Smoking status: Former Games developer  . Smokeless tobacco: Never Used  Substance and Sexual Activity  . Alcohol use: No  . Drug use: No  . 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  Other Topics Concern  . Not on file  Social History Narrative  . Not on file   Social Determinants of Health   Financial Resource Strain: Not on file  Food Insecurity: Not  on file  Transportation Needs: Not on file  Physical Activity: Not on file  Stress: Not on file  Social Connections: Not on file  Intimate Partner Violence: Not on file    Outpatient Medications Prior to Visit  Medication Sig Dispense Refill  . cyclobenzaprine (FLEXERIL) 10 MG tablet Take 0.5-1 tablets (5-10 mg total) by mouth 3 (three) times daily as needed for muscle spasms. Caution: can cause drowsiness 20 tablet 1  . dicyclomine (BENTYL) 20 MG tablet Take 1 tablet (20 mg total) by mouth every 6 (six) hours. 360 tablet 1  . hydrochlorothiazide (HYDRODIURIL) 25 MG tablet TAKE 1 TABLET BY MOUTH EVERY DAY 30 tablet 5  . losartan (COZAAR) 100 MG tablet TAKE 1 TABLET BY MOUTH EVERY DAY 90 tablet 0  . Multiple Vitamins-Minerals (MULTIVITAMIN WITH MINERALS) tablet Take 1 tablet by mouth daily.    . traZODone (DESYREL) 50 MG tablet TAKE 0.5 1 TABLETS (25 50 MG TOTAL) BY MOUTH AT BEDTIME AS NEEDED FOR SLEEP. 30 tablet 3   No  facility-administered medications prior to visit.    Allergies  Allergen Reactions  . Adderall [Amphetamine-Dextroamphetamine] Other (See Comments)    Increase BP  . Amitriptyline Other (See Comments)    Excess Sedation  . Lexapro [Escitalopram] Other (See Comments)    Constipation  . Sertraline Other (See Comments)    Dizziness    ROS Review of Systems    Objective:    Physical Exam Constitutional:      Appearance: He is well-developed.  HENT:     Head: Normocephalic and atraumatic.  Cardiovascular:     Rate and Rhythm: Normal rate and regular rhythm.     Heart sounds: Normal heart sounds.  Pulmonary:     Effort: Pulmonary effort is normal.     Breath sounds: Normal breath sounds.  Skin:    General: Skin is warm and dry.  Neurological:     Mental Status: He is alert and oriented to person, place, and time.  Psychiatric:        Behavior: Behavior normal.     BP 140/80   Pulse 81   Ht 5\' 7"  (1.702 m)   Wt 177 lb (80.3 kg)   SpO2 100%   BMI 27.72 kg/m  Wt Readings from Last 3 Encounters:  05/08/21 177 lb (80.3 kg)  08/20/20 180 lb (81.6 kg)  06/21/20 186 lb 4.8 oz (84.5 kg)     Health Maintenance Due  Topic Date Due  . COVID-19 Vaccine (1) Never done  . Zoster Vaccines- Shingrix (1 of 2) Never done    There are no preventive care reminders to display for this patient.  Lab Results  Component Value Date   TSH 2.69 06/05/2019   Lab Results  Component Value Date   WBC 4.7 06/21/2020   HGB 15.0 06/21/2020   HCT 44.2 06/21/2020   MCV 91.5 06/21/2020   PLT 239 06/21/2020   Lab Results  Component Value Date   NA 139 06/21/2020   K 4.4 06/21/2020   CO2 30 06/21/2020   GLUCOSE 100 (H) 06/21/2020   BUN 14 06/21/2020   CREATININE 0.98 06/21/2020   BILITOT 0.6 06/21/2020   ALKPHOS 69 02/28/2015   AST 28 06/21/2020   ALT 29 06/21/2020   PROT 7.3 06/21/2020   ALBUMIN 4.2 02/28/2015   CALCIUM 9.8 06/21/2020   Lab Results  Component Value Date    CHOL 226 (H) 06/21/2020   Lab Results  Component Value Date  HDL 40 06/21/2020   Lab Results  Component Value Date   LDLCALC 157 (H) 06/21/2020   Lab Results  Component Value Date   TRIG 159 (H) 06/21/2020   Lab Results  Component Value Date   CHOLHDL 5.7 (H) 06/21/2020   Lab Results  Component Value Date   HGBA1C 5.3 06/21/2020      Assessment & Plan:   Problem List Items Addressed This Visit      Cardiovascular and Mediastinum   Essential hypertension, benign    Pressure is a little borderline high today.  At home he reports he has been getting great blood pressures.  We just discussed keeping an eye on this as they did note some mild left ventricular concentric hypertrophy on his stress echo which was otherwise normal.        Other   Stress and adjustment reaction    Gust options.  He is able to make him self think about worrisome things and actually recreate the experience that he feels with the flushing etc.  Which tells me I do think a lot of this is stress related and due to lack of good sleep quality.  We discussed starting an SSRI instead of waiting.  We will start Lexapro discussed options of watchful monitor for side effects.  Given a small quantity of clonazepam to use sparingly did warn about potential for addiction.  And did warn about potential sedation with benzodiazepines.  Plan to follow-up in about 4 to 6 weeks to make sure that he is doing well.      Palpitations   Hyperlipidemia   Relevant Orders   Lipid Panel w/reflex Direct LDL    Other Visit Diagnoses    Hyponatremia    -  Primary   Relevant Orders   BASIC METABOLIC PANEL WITH GFR   Flushing         Palpitations-he is currently wearing a heart monitor for 2 weeks just to rule out any other underlying arrhythmias.  Could consider adding a low-dose beta-blocker for short period of time to reduce falls and sometimes can even reduce anxiety.  Meds ordered this encounter  Medications  .  clonazePAM (KLONOPIN) 0.5 MG tablet    Sig: Take 1 tablet (0.5 mg total) by mouth 2 (two) times daily as needed for anxiety.    Dispense:  15 tablet    Refill:  0  . escitalopram (LEXAPRO) 10 MG tablet    Sig: Take 0.5 tablets (5 mg total) by mouth daily for 10 days, THEN 1 tablet (10 mg total) daily for 20 days.    Dispense:  25 tablet    Refill:  0    Follow-up: Return in about 4 weeks (around 06/05/2021) for New start medication.   I spent 42 minutes on the day of the encounter to include pre-visit record review, face-to-face time with the patient and post visit ordering of test.   Nani Gasser, MD

## 2021-05-08 NOTE — Assessment & Plan Note (Signed)
Pressure is a little borderline high today.  At home he reports he has been getting great blood pressures.  We just discussed keeping an eye on this as they did note some mild left ventricular concentric hypertrophy on his stress echo which was otherwise normal.

## 2021-05-09 ENCOUNTER — Encounter: Payer: Self-pay | Admitting: Family Medicine

## 2021-05-09 DIAGNOSIS — F4329 Adjustment disorder with other symptoms: Secondary | ICD-10-CM | POA: Insufficient documentation

## 2021-05-09 NOTE — Assessment & Plan Note (Signed)
Gust options.  He is able to make him self think about worrisome things and actually recreate the experience that he feels with the flushing etc.  Which tells me I do think a lot of this is stress related and due to lack of good sleep quality.  We discussed starting an SSRI instead of waiting.  We will start Lexapro discussed options of watchful monitor for side effects.  Given a small quantity of clonazepam to use sparingly did warn about potential for addiction.  And did warn about potential sedation with benzodiazepines.  Plan to follow-up in about 4 to 6 weeks to make sure that he is doing well.

## 2021-05-14 LAB — BASIC METABOLIC PANEL WITH GFR
BUN: 11 mg/dL (ref 7–25)
CO2: 30 mmol/L (ref 20–32)
Calcium: 9.1 mg/dL (ref 8.6–10.3)
Chloride: 102 mmol/L (ref 98–110)
Creat: 0.8 mg/dL (ref 0.70–1.33)
GFR, Est African American: 120 mL/min/{1.73_m2} (ref >=60)
GFR, Est Non African American: 103 mL/min/{1.73_m2} (ref >=60)
Glucose, Bld: 98 mg/dL (ref 65–99)
Potassium: 4.1 mmol/L (ref 3.5–5.3)
Sodium: 139 mmol/L (ref 135–146)

## 2021-05-14 LAB — LIPID PANEL W/REFLEX DIRECT LDL
Cholesterol: 179 mg/dL (ref <200)
HDL: 43 mg/dL (ref >=40)
LDL Cholesterol (Calc): 107 mg/dL (calc) — ABNORMAL HIGH
Non-HDL Cholesterol (Calc): 136 mg/dL (calc) — ABNORMAL HIGH (ref <130)
Total CHOL/HDL Ratio: 4.2 (calc) (ref <5.0)
Triglycerides: 176 mg/dL — ABNORMAL HIGH (ref <150)

## 2021-05-21 ENCOUNTER — Encounter: Payer: Self-pay | Admitting: Family Medicine

## 2021-05-23 MED ORDER — METHYLPHENIDATE HCL ER (OSM) 27 MG PO TBCR: 27.0000 mg | EXTENDED_RELEASE_TABLET | ORAL | 0 refills | Status: DC

## 2021-05-23 NOTE — Addendum Note (Signed)
Addended by: Nani Gasser D on: 05/23/2021 09:42 AM   Modules accepted: Orders

## 2021-05-23 NOTE — Telephone Encounter (Signed)
Meds ordered this encounter  Medications   methylphenidate (CONCERTA) 27 MG PO CR tablet    Sig: Take 1 tablet (27 mg total) by mouth every morning.    Dispense:  30 tablet    Refill:  0    

## 2021-05-26 ENCOUNTER — Encounter: Payer: Self-pay | Admitting: Family Medicine

## 2021-05-26 MED ORDER — METHYLPHENIDATE HCL ER (OSM) 18 MG PO TBCR: 18.0000 mg | EXTENDED_RELEASE_TABLET | ORAL | 0 refills | Status: DC

## 2021-05-26 NOTE — Telephone Encounter (Signed)
Meds ordered this encounter  Medications   methylphenidate (CONCERTA) 18 MG PO CR tablet    Sig: Take 1 tablet (18 mg total) by mouth every morning.    Dispense:  30 tablet    Refill:  0

## 2021-05-30 ENCOUNTER — Other Ambulatory Visit: Payer: Self-pay | Admitting: Family Medicine

## 2021-06-06 ENCOUNTER — Encounter: Payer: Self-pay | Admitting: Family Medicine

## 2021-06-06 DIAGNOSIS — F902 Attention-deficit hyperactivity disorder, combined type: Secondary | ICD-10-CM

## 2021-06-08 NOTE — Telephone Encounter (Signed)
We can certainly look at [facility], it is a nonstimulant option.  Or we could give Adderall a try.  It is a stimulant but it is in a slightly different category than the Concerta which is methylphenidate.  Let me know if you have a preference and we can give that a try and then plan to follow-up in a month.

## 2021-06-09 MED ORDER — ATOMOXETINE HCL 10 MG PO CAPS
10.0000 mg | ORAL_CAPSULE | Freq: Two times a day (BID) | ORAL | 1 refills | Status: DC
Start: 2021-06-09 — End: 2021-07-29

## 2021-06-09 NOTE — Telephone Encounter (Signed)
Meds ordered this encounter  Medications   atomoxetine (STRATTERA) 10 MG capsule    Sig: Take 1 capsule (10 mg total) by mouth 2 (two) times daily with a meal.    Dispense:  60 capsule    Refill:  1

## 2021-06-12 ENCOUNTER — Telehealth (INDEPENDENT_AMBULATORY_CARE_PROVIDER_SITE_OTHER): Payer: Managed Care, Other (non HMO) | Admitting: Family Medicine

## 2021-06-12 ENCOUNTER — Encounter: Payer: Self-pay | Admitting: Family Medicine

## 2021-06-12 DIAGNOSIS — F902 Attention-deficit hyperactivity disorder, combined type: Secondary | ICD-10-CM | POA: Diagnosis not present

## 2021-06-12 DIAGNOSIS — I10 Essential (primary) hypertension: Secondary | ICD-10-CM | POA: Diagnosis not present

## 2021-06-12 DIAGNOSIS — F4329 Adjustment disorder with other symptoms: Secondary | ICD-10-CM | POA: Diagnosis not present

## 2021-06-12 DIAGNOSIS — I493 Ventricular premature depolarization: Secondary | ICD-10-CM

## 2021-06-12 DIAGNOSIS — F32A Depression, unspecified: Secondary | ICD-10-CM

## 2021-06-12 DIAGNOSIS — F419 Anxiety disorder, unspecified: Secondary | ICD-10-CM | POA: Diagnosis not present

## 2021-06-12 MED ORDER — CARVEDILOL 6.25 MG PO TABS: 6.2500 mg | ORAL_TABLET | Freq: Two times a day (BID) | ORAL | 0 refills | Status: DC

## 2021-06-12 MED ORDER — LISINOPRIL-HYDROCHLOROTHIAZIDE 20-25 MG PO TABS: 1.0000 | ORAL_TABLET | Freq: Every day | ORAL | 0 refills | Status: DC

## 2021-06-12 NOTE — Progress Notes (Signed)
Virtual Visit via Video Note  I connected with Robert Aguilar on 06/12/21 at 10:50 AM EDT by a video enabled telemedicine application and verified that I am speaking with the correct person using two identifiers.   I discussed the limitations of evaluation and management by telemedicine and the availability of in person appointments. The patient expressed understanding and agreed to proceed.  Patient location: at home Provider location: at home  Subjective:    CC: CP and HA  HPI: Yesterday was feeling thumping in his chest and checked his BP and it was 149/98 and pulse was up a little at 81. 158/99 this AM . But at other times.  120/70s.   Lexapro 10mg  caused some nausea but no constipation- says the increased dose caused the nausea and felt more mood swings.  Fund was using the klonopin prn. So now just taking half a tab.   Had heart monitor done 05/30/2021 and had some PVCs and PACs.  Some of episdoes were not associated with any arrythmia and some assoc with PVCs.    Having a hard time getting the losartan the last 2 times.  Pharmacy didn't have in stock.   Past medical history, Surgical history, Family history not pertinant except as noted below, Social history, Allergies, and medications have been entered into the medical record, reviewed, and corrections made.    Objective:    General: Speaking clearly in complete sentences without any shortness of breath.  Alert and oriented x3.  Normal judgment. No apparent acute distress.    Impression and Recommendations:    Essential hypertension, benign Uncontrolled. Though really BPs are up and down. Will add Carvedilol to also hlep BP and to help control PVCs and PACs. Will also change to lisinopril HCT since having difficulty getting losartan with back order issues.  F/U in 6-8 weeks.    Anxiety and depression Didn't tolerate 10mg  lexapro. Discussed options of staying at 5mg  or changing meds. She would prefer to stay at 5mg  for now  since recently made changes with ADD medicine  Stress and adjustment reaction See note above.   PVC (premature ventricular contraction) Start carvedilol.  Has taken previously. Will see if improves BP and palpitations.     No orders of the defined types were placed in this encounter.   Meds ordered this encounter  Medications   carvedilol (COREG) 6.25 MG tablet    Sig: Take 1 tablet (6.25 mg total) by mouth 2 (two) times daily with a meal.    Dispense:  30 tablet    Refill:  0   lisinopril-hydrochlorothiazide (ZESTORETIC) 20-25 MG tablet    Sig: Take 1 tablet by mouth daily.    Dispense:  90 tablet    Refill:  0     I discussed the assessment and treatment plan with the patient. The patient was provided an opportunity to ask questions and all were answered. The patient agreed with the plan and demonstrated an understanding of the instructions.   The patient was advised to call back or seek an in-person evaluation if the symptoms worsen or if the condition fails to improve as anticipated.   06/01/2021, MD

## 2021-06-12 NOTE — Assessment & Plan Note (Signed)
Didn't tolerate 10mg  lexapro. Discussed options of staying at 5mg  or changing meds. She would prefer to stay at 5mg  for now since recently made changes with ADD medicine

## 2021-06-12 NOTE — Assessment & Plan Note (Signed)
Start carvedilol.  Has taken previously. Will see if improves BP and palpitations.

## 2021-06-12 NOTE — Assessment & Plan Note (Addendum)
Uncontrolled. Though really BPs are up and down. Will add Carvedilol to also hlep BP and to help control PVCs and PACs. Will also change to lisinopril HCT since having difficulty getting losartan with back order issues.  F/U in 6-8 weeks.

## 2021-06-12 NOTE — Assessment & Plan Note (Signed)
See note above

## 2021-06-18 ENCOUNTER — Other Ambulatory Visit: Payer: Self-pay | Admitting: Family Medicine

## 2021-06-19 ENCOUNTER — Other Ambulatory Visit: Payer: Self-pay | Admitting: Family Medicine

## 2021-06-23 ENCOUNTER — Encounter: Payer: Self-pay | Admitting: Family Medicine

## 2021-06-23 DIAGNOSIS — F32A Depression, unspecified: Secondary | ICD-10-CM

## 2021-06-23 DIAGNOSIS — F419 Anxiety disorder, unspecified: Secondary | ICD-10-CM

## 2021-06-23 DIAGNOSIS — R002 Palpitations: Secondary | ICD-10-CM

## 2021-06-24 MED ORDER — DULOXETINE HCL 30 MG PO CPEP: 30.0000 mg | ORAL_CAPSULE | Freq: Every day | ORAL | 0 refills | Status: DC

## 2021-06-25 ENCOUNTER — Other Ambulatory Visit: Payer: Self-pay | Admitting: Family Medicine

## 2021-06-25 MED ORDER — AMPHETAMINE-DEXTROAMPHETAMINE 5 MG PO TABS: ORAL_TABLET | ORAL | 0 refills | Status: DC

## 2021-06-25 MED ORDER — CLONAZEPAM 0.5 MG PO TABS: 0.5000 mg | ORAL_TABLET | Freq: Every day | ORAL | 0 refills | Status: DC | PRN

## 2021-06-25 NOTE — Telephone Encounter (Signed)
Meds ordered this encounter  Medications   DULoxetine (CYMBALTA) 30 MG capsule    Sig: Take 1 capsule (30 mg total) by mouth daily.    Dispense:  30 capsule    Refill:  0   amphetamine-dextroamphetamine (ADDERALL) 5 MG tablet    Sig: 1 tab PO in AM and 1 tab after lunch    Dispense:  60 tablet    Refill:  0

## 2021-06-25 NOTE — Addendum Note (Signed)
Addended by: Nani Gasser D on: 06/25/2021 05:05 PM   Modules accepted: Orders

## 2021-06-25 NOTE — Progress Notes (Signed)
Meds ordered this encounter  Medications   clonazePAM (KLONOPIN) 0.5 MG tablet    Sig: Take 1 tablet (0.5 mg total) by mouth daily as needed for anxiety.    Dispense:  15 tablet    Refill:  0

## 2021-06-27 ENCOUNTER — Telehealth: Payer: Self-pay | Admitting: Family Medicine

## 2021-06-27 NOTE — Telephone Encounter (Signed)
Hi Cindy, I sent over a referral to [facility].  They did contact him and at the time he declined services.  That was earlier this week but since has changed his mind.  I just want to see if you can let them know that so they could try to reach out again I also encouraged him to give them a call back directly I disorder make sure that the referral does not get closed out.  And if it does not we need to do a new one then please let me know.

## 2021-06-30 ENCOUNTER — Telehealth: Payer: Self-pay

## 2021-06-30 NOTE — Telephone Encounter (Signed)
Transition Care Management Follow-up Telephone Call Date of discharge and from where: 06/28/2021 from [facility] How have you been since you were released from the hospital? Pt stated that he is feeling well and has no questions or concerns at this time.  Any questions or concerns? No  Items Reviewed: Did the pt receive and understand the discharge instructions provided? Yes  Medications obtained and verified? Yes  Other? No  Any new allergies since your discharge? No  Dietary orders reviewed? No Do you have support at home? Yes    Functional Questionnaire: (I = Independent and D = Dependent) ADLs: I  Bathing/Dressing- I  Meal Prep- I  Eating- I  Maintaining continence- I  Transferring/Ambulation- I  Managing Meds- I   Follow up appointments reviewed:  PCP Hospital f/u appt confirmed? No   Specialist Hospital f/u appt confirmed? Yes  [facility] Are transportation arrangements needed? No  If their condition worsens, is the pt aware to call PCP or go to the Emergency Dept.? Yes Was the patient provided with contact information for the PCP's office or ED? Yes Was to pt encouraged to call back with questions or concerns? Yes

## 2021-07-01 ENCOUNTER — Other Ambulatory Visit: Payer: Self-pay | Admitting: Family Medicine

## 2021-07-01 DIAGNOSIS — F902 Attention-deficit hyperactivity disorder, combined type: Secondary | ICD-10-CM

## 2021-07-01 LAB — TSH: TSH: 2.99 mIU/L (ref 0.40–4.50)

## 2021-07-10 ENCOUNTER — Other Ambulatory Visit: Payer: Self-pay | Admitting: Family Medicine

## 2021-07-11 MED ORDER — LISINOPRIL-HYDROCHLOROTHIAZIDE 20-12.5 MG PO TABS: 1.0000 | ORAL_TABLET | Freq: Every day | ORAL | 3 refills | Status: DC

## 2021-07-11 MED ORDER — TRAZODONE HCL 50 MG PO TABS: ORAL_TABLET | ORAL | 3 refills | Status: DC

## 2021-07-11 NOTE — Telephone Encounter (Signed)
Pt has not had this medication filled since 2020.  Please review for appropriateness of refill request.  Tiajuana Amass, CMA

## 2021-07-11 NOTE — Addendum Note (Signed)
Addended by: Stan Head on: 07/11/2021 10:55 AM   Modules accepted: Orders

## 2021-07-11 NOTE — Addendum Note (Signed)
Addended by: Nani Gasser D on: 07/11/2021 10:17 AM   Modules accepted: Orders

## 2021-07-17 ENCOUNTER — Other Ambulatory Visit: Payer: Self-pay | Admitting: Family Medicine

## 2021-07-17 DIAGNOSIS — F32A Depression, unspecified: Secondary | ICD-10-CM

## 2021-07-17 DIAGNOSIS — F419 Anxiety disorder, unspecified: Secondary | ICD-10-CM

## 2021-07-18 ENCOUNTER — Telehealth: Payer: Self-pay

## 2021-07-18 NOTE — Telephone Encounter (Signed)
Pt's spouse called stating that [employer] was resending FMLA papers because Dr. Linford Arnold needed to complete question #5.  Dr. Linford Arnold states that she has not received the paperwork for this additional information.  LVM for spouse to return call.  [employer] will need to resend paperwork.  Tiajuana Amass, CMA

## 2021-07-22 NOTE — Telephone Encounter (Signed)
Attempted to reach patient by phone, but the phone rang busy.  Tiajuana Amass, CMA

## 2021-07-23 NOTE — Telephone Encounter (Signed)
[employer] forms located by [employer].  She states that she will take care of this matter.  Tiajuana Amass, CMA

## 2021-07-28 ENCOUNTER — Encounter: Payer: Self-pay | Admitting: Family Medicine

## 2021-07-28 ENCOUNTER — Telehealth: Payer: Self-pay | Admitting: General Practice

## 2021-07-28 NOTE — Telephone Encounter (Signed)
Transition Care Management Follow-up Telephone Call Date of discharge and from where: 07/27/21 from [facility] How have you been since you were released from the hospital? Doing better. Any questions or concerns? No  Items Reviewed: Did the pt receive and understand the discharge instructions provided? Yes  Medications obtained and verified? Yes  Other? No  Any new allergies since your discharge? No  Dietary orders reviewed? Yes Do you have support at home? Yes   Home Care and Equipment/Supplies: Were home health services ordered? no   Functional Questionnaire: (I = Independent and D = Dependent) ADLs: I  Bathing/Dressing- I  Meal Prep- I  Eating- I  Maintaining continence- I  Transferring/Ambulation- I  Managing Meds- I  Follow up appointments reviewed:  PCP Hospital f/u appt confirmed? Patient left a voicemail to schedule the appointment with the PCP. Specialist Hospital f/u appt confirmed? Yes  Scheduled to see the cardiologist on 07/31/21 @ 2pm. Are transportation arrangements needed? No  If their condition worsens, is the pt aware to call PCP or go to the Emergency Dept.? Yes Was the patient provided with contact information for the PCP's office or ED? Yes Was to pt encouraged to call back with questions or concerns? Yes

## 2021-07-29 ENCOUNTER — Ambulatory Visit: Payer: 59 | Admitting: Professional

## 2021-07-29 ENCOUNTER — Other Ambulatory Visit: Payer: Self-pay

## 2021-07-29 ENCOUNTER — Encounter: Payer: Self-pay | Admitting: Family Medicine

## 2021-07-29 ENCOUNTER — Ambulatory Visit (INDEPENDENT_AMBULATORY_CARE_PROVIDER_SITE_OTHER): Payer: Managed Care, Other (non HMO) | Admitting: Family Medicine

## 2021-07-29 VITALS — BP 149/96 | HR 70 | Ht 67.0 in | Wt 175.0 lb

## 2021-07-29 DIAGNOSIS — I10 Essential (primary) hypertension: Secondary | ICD-10-CM

## 2021-07-29 DIAGNOSIS — N529 Male erectile dysfunction, unspecified: Secondary | ICD-10-CM | POA: Insufficient documentation

## 2021-07-29 DIAGNOSIS — F419 Anxiety disorder, unspecified: Secondary | ICD-10-CM | POA: Diagnosis not present

## 2021-07-29 DIAGNOSIS — R0789 Other chest pain: Secondary | ICD-10-CM | POA: Diagnosis not present

## 2021-07-29 DIAGNOSIS — F32A Depression, unspecified: Secondary | ICD-10-CM

## 2021-07-29 MED ORDER — TADALAFIL 20 MG PO TABS: 10.0000 mg | ORAL_TABLET | ORAL | 11 refills | Status: DC | PRN

## 2021-07-29 NOTE — Assessment & Plan Note (Signed)
Managed by psychiatry for now.  ON Cymbalta, Wellbutrin and PRN benzo. Will order genetic testing to better narrow down drugs that should be therapeutic.

## 2021-07-29 NOTE — Assessment & Plan Note (Signed)
Secondary to SSRI use.  Discussed options. Trial of PDE5 and consider ring for maintaining erection.

## 2021-07-29 NOTE — Progress Notes (Signed)
Acute Office Visit  Subjective:    Patient ID: Robert Aguilar, male    DOB: Apr 24, 1971, 50 y.o.   MRN: [id number]  Chief Complaint  Patient presents with  . Hospitalization Follow-up    HPI Patient is in today for f/u ED visit.  He was seen in the emergency department 2 days ago for left-sided chest pain.  His Lamictal dose was recently increased.  Striae of negative stress echo in May 2022.  In the emergency room, chest x-ray, CBC and CMP were all normal.   Also since I last saw him he started  having suicidal thoughts. He has been in intensive outpatient treatment and is doing much better. He has been started on clonazepam and Cymbalta for mood as well as Lamictal.  He had just gone up on his dose of Lamictal to 50 mg about 3 days before the chest pain started.  So after the event he contacted his mental health provider and they have taken him off of all Lamictal and started him on Wellbutrin in its place. Having some ED side effect. Having a hard time maintaining erection and reaching orgasm.    Is interested in having genetic testing for some of the behavioral health medications to see which ones would most likely be beneficial for him.  He has tried several in the past with side effects.  Hypertension-blood pressures have been running around 117 over 70s before he went to the ED on Sunday night.  Visit has come down since then but not quite to the baseline level.  It is in the 130s over 80s today.  He does have a follow-up with cardiology tomorrow.  Past Medical History:  Diagnosis Date  . Hypertension   . Laceration of right upper arm 09/23/2016    Past Surgical History:  Procedure Laterality Date  . SHOULDER SURGERY     LT    Family History  Problem Relation Age of Onset  . Hypertension Other   . Hyperlipidemia Other   . Heart attack Brother 54  . Colon cancer Other        grandmother, 2 aunts  . Coronary artery disease Other        family history  . Hypertension  Mother   . Hyperlipidemia Mother   . Heart disease Mother        heart attack  . Heart failure Mother   . Thyroid disease Mother   . Hypertension Father   . Hyperlipidemia Father   . Heart disease Father        heart attack  . Heart failure Father   . Diabetes Brother   . Hypertension Brother     Social History   Socioeconomic History  . Marital status: Married    Spouse name: Not on file  . Number of children: Not on file  . Years of education: Not on file  . Highest education level: Not on file  Occupational History  . Not on file  Tobacco Use  . Smoking status: Former  . Smokeless tobacco: Never  Substance and Sexual Activity  . Alcohol use: No  . Drug use: No  . 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  Other Topics Concern  . Not on file  Social History Narrative  . Not on file   Social Determinants of Health   Financial Resource Strain: Not on file  Food Insecurity:  Not on file  Transportation Needs: Not on file  Physical Activity: Not on file  Stress: Not on file  Social Connections: Not on file  Intimate Partner Violence: Not on file    Outpatient Medications Prior to Visit  Medication Sig Dispense Refill  . buPROPion (WELLBUTRIN XL) 150 MG 24 hr tablet Take 150 mg by mouth every morning.    . carvedilol (COREG) 6.25 MG tablet TAKE 1 TABLET BY MOUTH 2 TIMES DAILY WITH A MEAL. 180 tablet 1  . clonazePAM (KLONOPIN) 0.5 MG tablet Take 1 tablet (0.5 mg total) by mouth daily as needed for anxiety. 15 tablet 0  . cyclobenzaprine (FLEXERIL) 10 MG tablet Take 0.5-1 tablets (5-10 mg total) by mouth 3 (three) times daily as needed for muscle spasms. Caution: can cause drowsiness 20 tablet 1  . dicyclomine (BENTYL) 20 MG tablet Take 1 tablet (20 mg total) by mouth every 6 (six) hours. 360 tablet 1  . DULoxetine (CYMBALTA) 30 MG capsule TAKE 1 CAPSULE BY MOUTH EVERY DAY  90 capsule 1  . lisinopril-hydrochlorothiazide (ZESTORETIC) 20-12.5 MG tablet Take 1 tablet by mouth daily. 90 tablet 3  . Multiple Vitamins-Minerals (MULTIVITAMIN WITH MINERALS) tablet Take 1 tablet by mouth daily.    . traZODone (DESYREL) 50 MG tablet TAKE 0.5 1 TABLETS (25 50 MG TOTAL) BY MOUTH AT BEDTIME AS NEEDED FOR SLEEP. 30 tablet 3  . amphetamine-dextroamphetamine (ADDERALL) 5 MG tablet 1 tab PO in AM and 1 tab after lunch 60 tablet 0  . atomoxetine (STRATTERA) 10 MG capsule Take 1 capsule (10 mg total) by mouth 2 (two) times daily with a meal. 60 capsule 1   No facility-administered medications prior to visit.    Allergies  Allergen Reactions  . Adderall [Amphetamine-Dextroamphetamine] Other (See Comments)    Increase BP  . Amitriptyline Other (See Comments)    Excess Sedation  . Lexapro [Escitalopram] Other (See Comments)    Constipation  . Abilify [Aripiprazole] Other (See Comments)    Muscle weakness, tics  . Concerta [Methylphenidate] Other (See Comments)    Increase in BP  . Lamictal [Lamotrigine] Other (See Comments)    Chest pain  . Sertraline Other (See Comments)    Dizziness    Review of Systems     Objective:    Physical Exam  BP (!) 149/96 (BP Location: Right Arm, Patient Position: Sitting, Cuff Size: Normal)   Pulse 70   Ht 5\' 7"  (1.702 m)   Wt 175 lb (79.4 kg)   SpO2 100%   BMI 27.41 kg/m  Wt Readings from Last 3 Encounters:  07/29/21 175 lb (79.4 kg)  05/08/21 177 lb (80.3 kg)  08/20/20 180 lb (81.6 kg)    Health Maintenance Due  Topic Date Due  . COVID-19 Vaccine (1) Never done  . Pneumococcal Vaccine 29-50 Years old (1 - PCV) Never done  . Zoster Vaccines- Shingrix (1 of 2) Never done  . INFLUENZA VACCINE  07/14/2021    There are no preventive care reminders to display for this patient.   Lab Results  Component Value Date   TSH 2.99 07/01/2021   Lab Results  Component Value Date   WBC 4.7 06/21/2020   HGB 15.0 06/21/2020    HCT 44.2 06/21/2020   MCV 91.5 06/21/2020   PLT 239 06/21/2020   Lab Results  Component Value Date   NA 139 05/13/2021   K 4.1 05/13/2021   CO2 30 05/13/2021   GLUCOSE 98 05/13/2021   BUN 11  05/13/2021   CREATININE 0.80 05/13/2021   BILITOT 0.6 06/21/2020   ALKPHOS 69 02/28/2015   AST 28 06/21/2020   ALT 29 06/21/2020   PROT 7.3 06/21/2020   ALBUMIN 4.2 02/28/2015   CALCIUM 9.1 05/13/2021   Lab Results  Component Value Date   CHOL 179 05/13/2021   Lab Results  Component Value Date   HDL 43 05/13/2021   Lab Results  Component Value Date   LDLCALC 107 (H) 05/13/2021   Lab Results  Component Value Date   TRIG 176 (H) 05/13/2021   Lab Results  Component Value Date   CHOLHDL 4.2 05/13/2021   Lab Results  Component Value Date   HGBA1C 5.3 06/21/2020       Assessment & Plan:   Problem List Items Addressed This Visit       Cardiovascular and Mediastinum   Essential hypertension, benign    BP elevated today but normally better controlled. Will monitor while on Wellbutrin.  F/U with Cards tomorrow.       Relevant Medications   tadalafil (CIALIS) 20 MG tablet     Other   Erectile dysfunction    Secondary to SSRI use.  Discussed options. Trial of PDE5 and consider ring for maintaining erection.       Anxiety and depression    Managed by psychiatry for now.  ON Cymbalta, Wellbutrin and PRN benzo. Will order genetic testing to better narrow down drugs that should be therapeutic.        Relevant Medications   buPROPion (WELLBUTRIN XL) 150 MG 24 hr tablet   Other Relevant Orders   [facility]   Other Visit Diagnoses     Atypical chest pain    -  Primary      Atypical CP- pain is improving.  Off of Lamictal which was possible trigger.   Meds ordered this encounter  Medications  . tadalafil (CIALIS) 20 MG tablet    Sig: Take 0.5-1 tablets (10-20 mg total) by mouth every other day as needed for erectile dysfunction.     Dispense:  10 tablet    Refill:  11     Nani Gasser, MD

## 2021-07-29 NOTE — Assessment & Plan Note (Signed)
BP elevated today but normally better controlled. Will monitor while on Wellbutrin.  F/U with Cards tomorrow.

## 2021-08-04 ENCOUNTER — Other Ambulatory Visit: Payer: Self-pay | Admitting: Family Medicine

## 2021-08-06 NOTE — Telephone Encounter (Signed)
Meds ordered this encounter  Medications   traZODone (DESYREL) 50 MG tablet    Sig: TAKE 0.5 - 1 TABLETS (25 50 MG TOTAL) BY MOUTH AT BEDTIME AS NEEDED FOR SLEEP.    Dispense:  90 tablet    Refill:  1

## 2021-08-20 ENCOUNTER — Other Ambulatory Visit: Payer: Self-pay | Admitting: Family Medicine

## 2021-08-20 DIAGNOSIS — K588 Other irritable bowel syndrome: Secondary | ICD-10-CM

## 2021-10-10 ENCOUNTER — Encounter: Payer: Self-pay | Admitting: Family Medicine

## 2021-11-26 ENCOUNTER — Ambulatory Visit (INDEPENDENT_AMBULATORY_CARE_PROVIDER_SITE_OTHER): Payer: Managed Care, Other (non HMO) | Admitting: Family Medicine

## 2021-11-26 ENCOUNTER — Encounter: Payer: Self-pay | Admitting: Family Medicine

## 2021-11-26 ENCOUNTER — Other Ambulatory Visit: Payer: Self-pay

## 2021-11-26 VITALS — BP 130/82 | HR 72 | Ht 67.0 in | Wt 181.0 lb

## 2021-11-26 DIAGNOSIS — I10 Essential (primary) hypertension: Secondary | ICD-10-CM | POA: Diagnosis not present

## 2021-11-26 DIAGNOSIS — F902 Attention-deficit hyperactivity disorder, combined type: Secondary | ICD-10-CM

## 2021-11-26 DIAGNOSIS — F419 Anxiety disorder, unspecified: Secondary | ICD-10-CM

## 2021-11-26 DIAGNOSIS — J018 Other acute sinusitis: Secondary | ICD-10-CM | POA: Diagnosis not present

## 2021-11-26 DIAGNOSIS — F32A Depression, unspecified: Secondary | ICD-10-CM

## 2021-11-26 MED ORDER — AMOXICILLIN-POT CLAVULANATE 875-125 MG PO TABS: 1.0000 | ORAL_TABLET | Freq: Two times a day (BID) | ORAL | 0 refills | Status: DC

## 2021-11-26 NOTE — Assessment & Plan Note (Signed)
He will speak with psychiatry about the Adderall.  They could consider switching to Vyvanse that does have a little bit smoother release of that certainly an option or even just decreasing the Adderall.  The main reason that they started with 15 was because that is what he was able to find in stock because of the national backorder issue.

## 2021-11-26 NOTE — Progress Notes (Addendum)
Established Patient Office Visit  Subjective:  Patient ID: Robert Aguilar, male    DOB: 10-Sep-1971  Age: 50 y.o. MRN: [id number]  CC:  Chief Complaint  Patient presents with   Hypertension    HPI Robert Aguilar presents for   Hypertension- Pt denies chest pain, SOB, dizziness, or heart palpitations.  Taking meds as directed w/o problems.  Denies medication side effects.    He sees psychiatry for his Adderral now.  He is currently on Adderall extended release 15 mg daily.  He says he can usually start to feel it kick in and he will get a little bit of a pressure sensation in between his eyes on the lower forehead area and then he can tell when it is peaking because it will intensify its not painful.  But he definitely notices it.  He also feels like in the afternoon is when he will notice that his blood pressure can shoot up on the Adderall sometimes to 140 and that the pulse might increase to around 90.  But he is can I speak to them about potentially adjusting his medication.  He also thinks he might have a sinus infection.  He was in a pool about 2 weeks ago hotel and felt the water go up into his nose and ear.  He says since then he has been having some congestion and burning and some popping in his right ear.   Regards to his mood he is now on Rexulti 0.5 mg and says he is actually been doing really well on it he feels like its been helpful and effective.  Past Medical History:  Diagnosis Date   Hypertension    Laceration of right upper arm 09/23/2016    Past Surgical History:  Procedure Laterality Date   SHOULDER SURGERY     LT    Family History  Problem Relation Age of Onset   Hypertension Other    Hyperlipidemia Other    Heart attack Brother 71   Colon cancer Other        grandmother, 2 aunts   Coronary artery disease Other        family history   Hypertension Mother    Hyperlipidemia Mother    Heart disease Mother        heart attack   Heart failure Mother     Thyroid disease Mother    Hypertension Father    Hyperlipidemia Father    Heart disease Father        heart attack   Heart failure Father    Diabetes Brother    Hypertension Brother     Social History   Socioeconomic History   Marital status: Married    Spouse name: Not on file   Number of children: Not on file   Years of education: Not on file   Highest education level: Not on file  Occupational History   Not on file  Tobacco Use   Smoking status: Former   Smokeless tobacco: Never  Substance and Sexual Activity   Alcohol use: No   Drug use: No   Sexual activity: Not on file    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  Other Topics Concern   Not on file  Social History Narrative   Not on file   Social Determinants of Health   Financial Resource Strain: Not on file  Food Insecurity: Not on file  Transportation Needs: Not on  file  Physical Activity: Not on file  Stress: Not on file  Social Connections: Not on file  Intimate Partner Violence: Not on file    Outpatient Medications Prior to Visit  Medication Sig Dispense Refill   ADDERALL XR 15 MG 24 hr capsule Take 15 mg by mouth daily.     carvedilol (COREG) 6.25 MG tablet TAKE 1 TABLET BY MOUTH 2 TIMES DAILY WITH A MEAL. 180 tablet 1   clonazePAM (KLONOPIN) 0.5 MG tablet Take 1 tablet (0.5 mg total) by mouth daily as needed for anxiety. 15 tablet 0   cyclobenzaprine (FLEXERIL) 10 MG tablet Take 0.5-1 tablets (5-10 mg total) by mouth 3 (three) times daily as needed for muscle spasms. Caution: can cause drowsiness 20 tablet 1   dicyclomine (BENTYL) 10 MG capsule Take 10 mg by mouth 4 (four) times daily.     lisinopril-hydrochlorothiazide (ZESTORETIC) 20-12.5 MG tablet Take 1 tablet by mouth daily. 90 tablet 3   Multiple Vitamins-Minerals (MULTIVITAMIN WITH MINERALS) tablet Take 1 tablet by mouth daily.     tadalafil (CIALIS) 20 MG tablet Take 0.5-1  tablets (10-20 mg total) by mouth every other day as needed for erectile dysfunction. 10 tablet 11   traZODone (DESYREL) 50 MG tablet TAKE 0.5 - 1 TABLETS (25 50 MG TOTAL) BY MOUTH AT BEDTIME AS NEEDED FOR SLEEP. 90 tablet 1   busPIRone (BUSPAR) 5 MG tablet Take 5 mg by mouth 3 (three) times daily.     buPROPion (WELLBUTRIN XL) 150 MG 24 hr tablet Take 150 mg by mouth every morning.     dicyclomine (BENTYL) 20 MG tablet TAKE 1 TABLET (20 MG TOTAL) BY MOUTH EVERY 6 (SIX) HOURS. 120 tablet 5   DULoxetine (CYMBALTA) 30 MG capsule TAKE 1 CAPSULE BY MOUTH EVERY DAY 90 capsule 1   No facility-administered medications prior to visit.    Allergies  Allergen Reactions   Adderall [Amphetamine-Dextroamphetamine] Other (See Comments)    Increase BP   Amitriptyline Other (See Comments)    Excess Sedation   Lexapro [Escitalopram] Other (See Comments)    Constipation   Abilify [Aripiprazole] Other (See Comments)    Muscle weakness, tics   Concerta [Methylphenidate] Other (See Comments)    Increase in BP   Lamictal [Lamotrigine] Other (See Comments)    Chest pain   Sertraline Other (See Comments)    Dizziness    ROS Review of Systems    Objective:    Physical Exam Constitutional:      Appearance: He is well-developed.  HENT:     Head: Normocephalic and atraumatic.     Right Ear: Ear canal and external ear normal.     Left Ear: Tympanic membrane, ear canal and external ear normal.     Ears:     Comments: Is dull and slightly dark in appearance but no erythema or fluid.  No good light reflex.  It looks retracted.    Nose: Nose normal.     Mouth/Throat:     Mouth: Mucous membranes are moist.     Pharynx: Oropharynx is clear. No posterior oropharyngeal erythema.  Eyes:     Conjunctiva/sclera: Conjunctivae normal.     Pupils: Pupils are equal, round, and reactive to light.  Neck:     Thyroid: No thyromegaly.  Cardiovascular:     Rate and Rhythm: Normal rate.     Heart sounds: Normal  heart sounds.  Pulmonary:     Effort: Pulmonary effort is normal.     Breath  sounds: Normal breath sounds.  Musculoskeletal:     Cervical back: Neck supple.  Lymphadenopathy:     Cervical: No cervical adenopathy.  Skin:    General: Skin is warm and dry.  Neurological:     Mental Status: He is alert and oriented to person, place, and time.  Psychiatric:        Mood and Affect: Mood normal.    BP 130/82    Pulse 72    Ht  (1.702 m)    Wt 181 lb (82.1 kg)    SpO2 100%    BMI 28.35 kg/m  Wt Readings from Last 3 Encounters:  11/26/21 181 lb (82.1 kg)  07/29/21 175 lb (79.4 kg)  05/08/21 177 lb (80.3 kg)     There are no preventive care reminders to display for this patient.   There are no preventive care reminders to display for this patient.  Lab Results  Component Value Date   TSH 2.99 07/01/2021   Lab Results  Component Value Date   WBC 4.7 06/21/2020   HGB 15.0 06/21/2020   HCT 44.2 06/21/2020   MCV 91.5 06/21/2020   PLT 239 06/21/2020   Lab Results  Component Value Date   NA 139 05/13/2021   K 4.1 05/13/2021   CO2 30 05/13/2021   GLUCOSE 98 05/13/2021   BUN 11 05/13/2021   CREATININE 0.80 05/13/2021   BILITOT 0.6 06/21/2020   ALKPHOS 69 02/28/2015   AST 28 06/21/2020   ALT 29 06/21/2020   PROT 7.3 06/21/2020   ALBUMIN 4.2 02/28/2015   CALCIUM 9.1 05/13/2021   Lab Results  Component Value Date   CHOL 179 05/13/2021   Lab Results  Component Value Date   HDL 43 05/13/2021   Lab Results  Component Value Date   LDLCALC 107 (H) 05/13/2021   Lab Results  Component Value Date   TRIG 176 (H) 05/13/2021   Lab Results  Component Value Date   CHOLHDL 4.2 05/13/2021   Lab Results  Component Value Date   HGBA1C 5.3 06/21/2020      Assessment & Plan:   Problem List Items Addressed This Visit       Cardiovascular and Mediastinum   Essential hypertension, benign - Primary    Well controlled. Continue current regimen.  We did discuss  though that if he is, stick with the Adderall it may be worth adjusting his carvedilol to bring the blood pressure down just a little bit so that if he does have a slight bump in the afternoon that its not going too high.  Most likely adjust the carvedilol.  Follow up in  6 mo         Other   Anxiety and depression    Doing well on Rexulti..  But is still working with a therapist and psychiatrist.      ADHD    He will speak with psychiatry about the Adderall.  They could consider switching to Vyvanse that does have a little bit smoother release of that certainly an option or even just decreasing the Adderall.  The main reason that they started with 15 was because that is what he was able to find in stock because of the national backorder issue.      Other Visit Diagnoses     Acute non-recurrent sinusitis of other sinus       Relevant Medications   amoxicillin-clavulanate (AUGMENTIN) 875-125 MG tablet      Acute sinusitis-we will treat  with Augmentin.  Call if not better in 1 week.   Meds ordered this encounter  Medications   amoxicillin-clavulanate (AUGMENTIN) 875-125 MG tablet    Sig: Take 1 tablet by mouth 2 (two) times daily.    Dispense:  20 tablet    Refill:  0    Follow-up: Return in about 6 months (around 05/27/2022) for Hypertension.    Nani Gasser, MD

## 2021-11-26 NOTE — Assessment & Plan Note (Addendum)
Well controlled. Continue current regimen.  We did discuss though that if he is, stick with the Adderall it may be worth adjusting his carvedilol to bring the blood pressure down just a little bit so that if he does have a slight bump in the afternoon that its not going too high.  Most likely adjust the carvedilol.  Follow up in  6 mo

## 2021-11-26 NOTE — Assessment & Plan Note (Addendum)
Doing well on Rexulti..  But is still working with a Paramedic and psychiatrist.

## 2021-12-18 ENCOUNTER — Encounter: Payer: Self-pay | Admitting: Family Medicine

## 2021-12-24 ENCOUNTER — Ambulatory Visit: Payer: Managed Care, Other (non HMO) | Admitting: Family Medicine

## 2021-12-24 ENCOUNTER — Encounter: Payer: Self-pay | Admitting: Family Medicine

## 2021-12-24 ENCOUNTER — Other Ambulatory Visit: Payer: Self-pay

## 2021-12-24 VITALS — BP 138/85 | HR 69 | Temp 97.7°F | Resp 18 | Ht 67.0 in | Wt 181.0 lb

## 2021-12-24 DIAGNOSIS — H6981 Other specified disorders of Eustachian tube, right ear: Secondary | ICD-10-CM

## 2021-12-24 DIAGNOSIS — F4323 Adjustment disorder with mixed anxiety and depressed mood: Secondary | ICD-10-CM | POA: Diagnosis not present

## 2021-12-24 DIAGNOSIS — I10 Essential (primary) hypertension: Secondary | ICD-10-CM | POA: Diagnosis not present

## 2021-12-24 LAB — BASIC METABOLIC PANEL WITH GFR
BUN: 17 mg/dL (ref 7–25)
CO2: 32 mmol/L (ref 20–32)
Calcium: 9.8 mg/dL (ref 8.6–10.3)
Chloride: 99 mmol/L (ref 98–110)
Creat: 0.88 mg/dL (ref 0.70–1.30)
Glucose, Bld: 103 mg/dL — ABNORMAL HIGH (ref 65–99)
Potassium: 4.6 mmol/L (ref 3.5–5.3)
Sodium: 137 mmol/L (ref 135–146)
eGFR: 103 mL/min/{1.73_m2} (ref >=60)

## 2021-12-24 MED ORDER — LOSARTAN POTASSIUM-HCTZ 100-12.5 MG PO TABS: 1.0000 | ORAL_TABLET | Freq: Every day | ORAL | 0 refills | Status: DC

## 2021-12-24 NOTE — Progress Notes (Signed)
Established Patient Office Visit  Subjective:  Patient ID: Robert Aguilar, male    DOB: June 22, 1971  Age: 51 y.o. MRN: [id number]  CC:  Chief Complaint  Patient presents with   Follow-up    Right ear pain. Patient states he still has right ear pressure, popping and cracking.     HPI Robert Aguilar presents for f/u recheck on right ear.  He was treated for sinusitis a few weeks ago but would like me to take a look in his right ear he reports that he still feels some pressure.  Noticing popping and cracking as well.  His sinus symptoms though have otherwise resolved which is great.  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  He is not currently on an SSRI or SNRI but he is being treated for his ADHD.  They recently switched him from extended release to short acting.  They are still trying to adjust his medication.  Hypertension- Pt denies chest pain, SOB, dizziness, or heart palpitations.  Taking meds as directed w/o problems.  Denies medication side effects.  Reports home blood pressures read have been running in the upper 120s and low 130s.   Past Medical History:  Diagnosis Date   Hypertension    Laceration of right upper arm 09/23/2016    Past Surgical History:  Procedure Laterality Date   SHOULDER SURGERY     LT    Family History  Problem Relation Age of Onset   Hypertension Other    Hyperlipidemia Other    Heart attack Brother 19   Colon cancer Other        grandmother, 2 aunts   Coronary artery disease Other        family history   Hypertension Mother    Hyperlipidemia Mother    Heart disease Mother        heart attack   Heart failure Mother    Thyroid disease Mother    Hypertension Father    Hyperlipidemia Father    Heart disease Father        heart attack   Heart failure Father    Diabetes Brother    Hypertension Brother     Social  History   Socioeconomic History   Marital status: Married    Spouse name: Not on file   Number of children: Not on file   Years of education: Not on file   Highest education level: Not on file  Occupational History   Not on file  Tobacco Use   Smoking status: Former   Smokeless tobacco: Never  Substance and Sexual Activity   Alcohol use: No   Drug use: No   Sexual activity: Not on file    Comment: 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  Other Topics Concern   Not on file  Social History Narrative   Not on file   Social Determinants of Health   Financial Resource Strain: Not on file  Food Insecurity: Not on file  Transportation Needs: Not on file  Physical Activity: Not on file  Stress: Not on file  Social Connections: Not on file  Intimate Partner Violence: Not on file    Outpatient Medications Prior to Visit  Medication Sig Dispense Refill   amphetamine-dextroamphetamine (ADDERALL) 20 MG tablet Take 20 mg by mouth daily.     carvedilol (COREG)  6.25 MG tablet TAKE 1 TABLET BY MOUTH 2 TIMES DAILY WITH A MEAL. 180 tablet 1   clonazePAM (KLONOPIN) 0.5 MG tablet Take 1 tablet (0.5 mg total) by mouth daily as needed for anxiety. 15 tablet 0   cyclobenzaprine (FLEXERIL) 10 MG tablet Take 0.5-1 tablets (5-10 mg total) by mouth 3 (three) times daily as needed for muscle spasms. Caution: can cause drowsiness 20 tablet 1   dicyclomine (BENTYL) 10 MG capsule Take 10 mg by mouth 4 (four) times daily.     Multiple Vitamins-Minerals (MULTIVITAMIN WITH MINERALS) tablet Take 1 tablet by mouth daily.     tadalafil (CIALIS) 20 MG tablet Take 0.5-1 tablets (10-20 mg total) by mouth every other day as needed for erectile dysfunction. 10 tablet 11   traZODone (DESYREL) 50 MG tablet TAKE 0.5 - 1 TABLETS (25 50 MG TOTAL) BY MOUTH AT BEDTIME AS NEEDED FOR SLEEP. 90 tablet 1   ADDERALL XR 15 MG 24 hr capsule Take 15 mg by mouth daily.      lisinopril-hydrochlorothiazide (ZESTORETIC) 20-12.5 MG tablet Take 1 tablet by mouth daily. 90 tablet 3   amoxicillin-clavulanate (AUGMENTIN) 875-125 MG tablet Take 1 tablet by mouth 2 (two) times daily. 20 tablet 0   No facility-administered medications prior to visit.    Allergies  Allergen Reactions   Adderall [Amphetamine-Dextroamphetamine] Other (See Comments)    Increase BP   Amitriptyline Other (See Comments)    Excess Sedation   Lexapro [Escitalopram] Other (See Comments)    Constipation   Abilify [Aripiprazole] Other (See Comments)    Muscle weakness, tics   Concerta [Methylphenidate] Other (See Comments)    Increase in BP   Lamictal [Lamotrigine] Other (See Comments)    Chest pain   Sertraline Other (See Comments)    Dizziness    ROS Review of Systems    Objective:    Physical Exam Constitutional:      Appearance: Normal appearance. He is well-developed.  HENT:     Head: Normocephalic and atraumatic.  Cardiovascular:     Rate and Rhythm: Normal rate and regular rhythm.     Heart sounds: Normal heart sounds.  Pulmonary:     Effort: Pulmonary effort is normal.     Breath sounds: Normal breath sounds.  Skin:    General: Skin is warm and dry.  Neurological:     Mental Status: He is alert and oriented to person, place, and time. Mental status is at baseline.  Psychiatric:        Behavior: Behavior normal.    BP 138/85 (BP Location: Right Arm)    Pulse 69    Temp 97.7 F (36.5 C)    Resp 18    Ht 5\' 7"  (1.702 m)    Wt 181 lb (82.1 kg)    SpO2 100%    BMI 28.35 kg/m  Wt Readings from Last 3 Encounters:  12/24/21 181 lb (82.1 kg)  11/26/21 181 lb (82.1 kg)  07/29/21 175 lb (79.4 kg)     There are no preventive care reminders to display for this patient.  There are no preventive care reminders to display for this patient.  Lab Results  Component Value Date   TSH 2.99 07/01/2021   Lab Results  Component Value Date   WBC 4.7 06/21/2020   HGB 15.0  06/21/2020   HCT 44.2 06/21/2020   MCV 91.5 06/21/2020   PLT 239 06/21/2020   Lab Results  Component Value Date   NA 139 05/13/2021  K 4.1 05/13/2021   CO2 30 05/13/2021   GLUCOSE 98 05/13/2021   BUN 11 05/13/2021   CREATININE 0.80 05/13/2021   BILITOT 0.6 06/21/2020   ALKPHOS 69 02/28/2015   AST 28 06/21/2020   ALT 29 06/21/2020   PROT 7.3 06/21/2020   ALBUMIN 4.2 02/28/2015   CALCIUM 9.1 05/13/2021   Lab Results  Component Value Date   CHOL 179 05/13/2021   Lab Results  Component Value Date   HDL 43 05/13/2021   Lab Results  Component Value Date   LDLCALC 107 (H) 05/13/2021   Lab Results  Component Value Date   TRIG 176 (H) 05/13/2021   Lab Results  Component Value Date   CHOLHDL 4.2 05/13/2021   Lab Results  Component Value Date   HGBA1C 5.3 06/21/2020      Assessment & Plan:   Problem List Items Addressed This Visit       Cardiovascular and Mediastinum   Essential hypertension, benign    Switch from lisinopril HCTZ to losartan HCTZ.  We will maintain the same HCTZ component dose.  We will hopefully get about 5 points reduction in his blood pressure and see if this is helpful.  Also encouraged him to continue to work on healthy diet and regular exercise to help lower blood pressure as well.      Relevant Medications   losartan-hydrochlorothiazide (HYZAAR) 100-12.5 MG tablet   Other Relevant Orders   BASIC METABOLIC PANEL WITH GFR     Other   Adjustment disorder with mixed anxiety and depressed mood - Primary    Currently on a medication regimen is working with his therapist regularly.  Though is currently on medication for his ADHD.      Other Visit Diagnoses     Dysfunction of right eustachian tube           Eustachian tube dysfunction-recommend a trial of a nasal steroid spray.  We will avoid decongestants as he does not tolerate those well.  If not responding to nasal steroid spray then consider oral prednisone.  Though he reports  sometimes prednisone can affect his mood.  Meds ordered this encounter  Medications   losartan-hydrochlorothiazide (HYZAAR) 100-12.5 MG tablet    Sig: Take 1 tablet by mouth daily.    Dispense:  90 tablet    Refill:  0    Follow-up: Return in about 3 months (around 03/24/2022) for Hypertension.    Nani Gasser, MD

## 2021-12-24 NOTE — Assessment & Plan Note (Signed)
Currently on a medication regimen is working with his therapist regularly.  Though is currently on medication for his ADHD.

## 2021-12-24 NOTE — Patient Instructions (Signed)
We are changing your lisinopril HCTZ to losartan HCTZ which is a little bit more powerful and should still get [place] that 5 points that we need.  It still once a day, still generic. Lease send me your updated home blood pressures in about 2 to 3 weeks.

## 2021-12-24 NOTE — Assessment & Plan Note (Signed)
Switch from lisinopril HCTZ to losartan HCTZ.  We will maintain the same HCTZ component dose.  We will hopefully get about 5 points reduction in his blood pressure and see if this is helpful.  Also encouraged him to continue to work on healthy diet and regular exercise to help lower blood pressure as well.

## 2021-12-25 NOTE — Progress Notes (Signed)
Your lab work is within acceptable range and there are no concerning findings.   ?

## 2022-01-30 ENCOUNTER — Other Ambulatory Visit: Payer: Self-pay | Admitting: Family Medicine

## 2022-03-15 IMAGING — DX DG THORACIC SPINE 3V
3 series · 3 of 3 positions shown · non-contrast
Comparison: None.

CLINICAL DATA: Mid upper lower thoracic pain

EXAM:
THORACIC SPINE - 3 VIEWS

[t-spine ap]
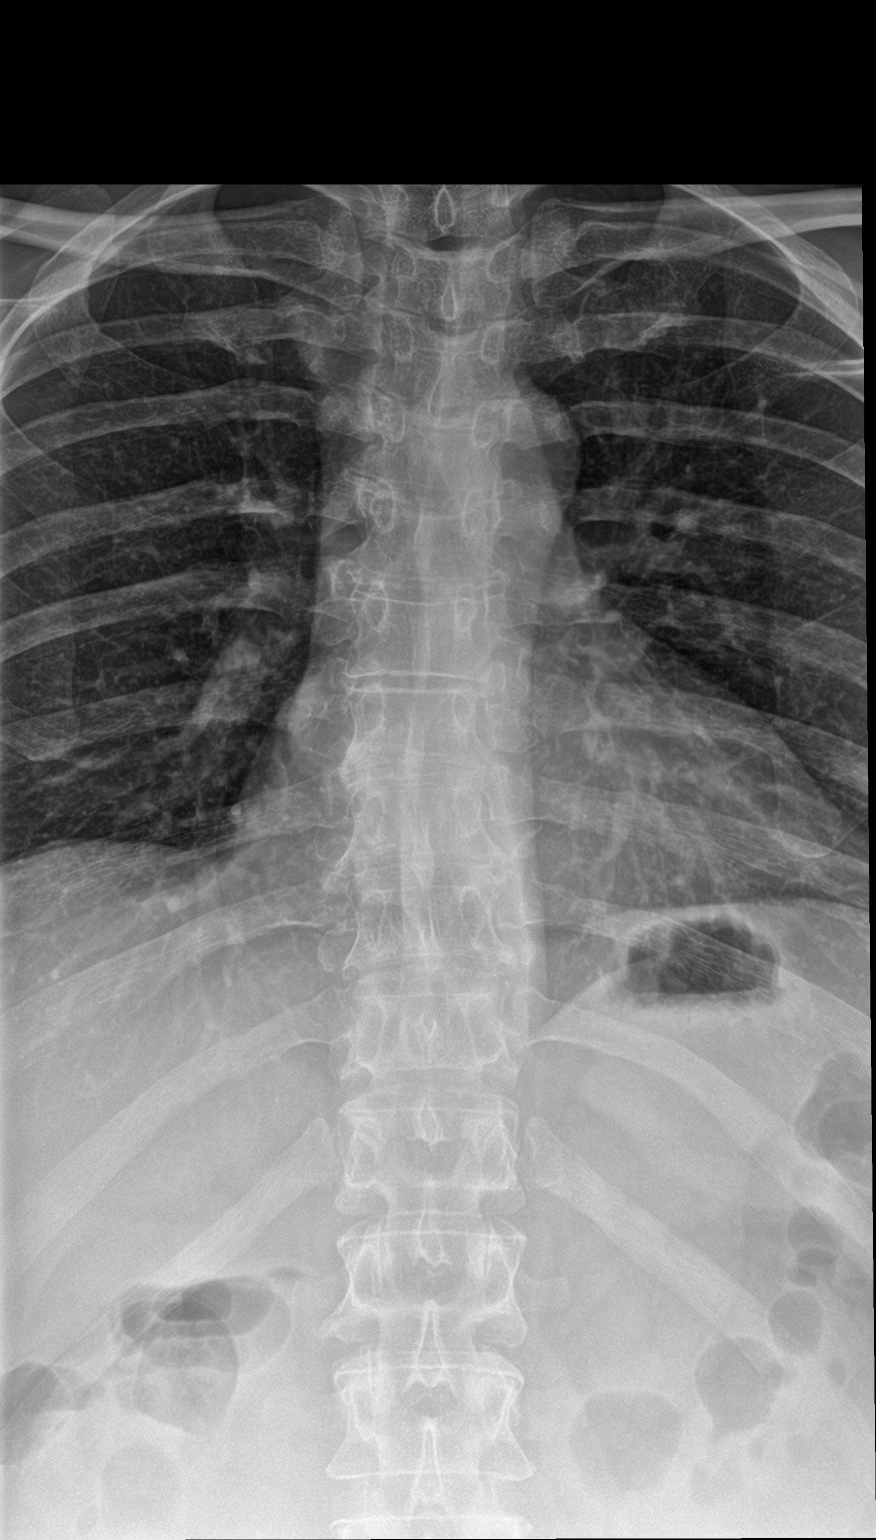

[t-spine lat]
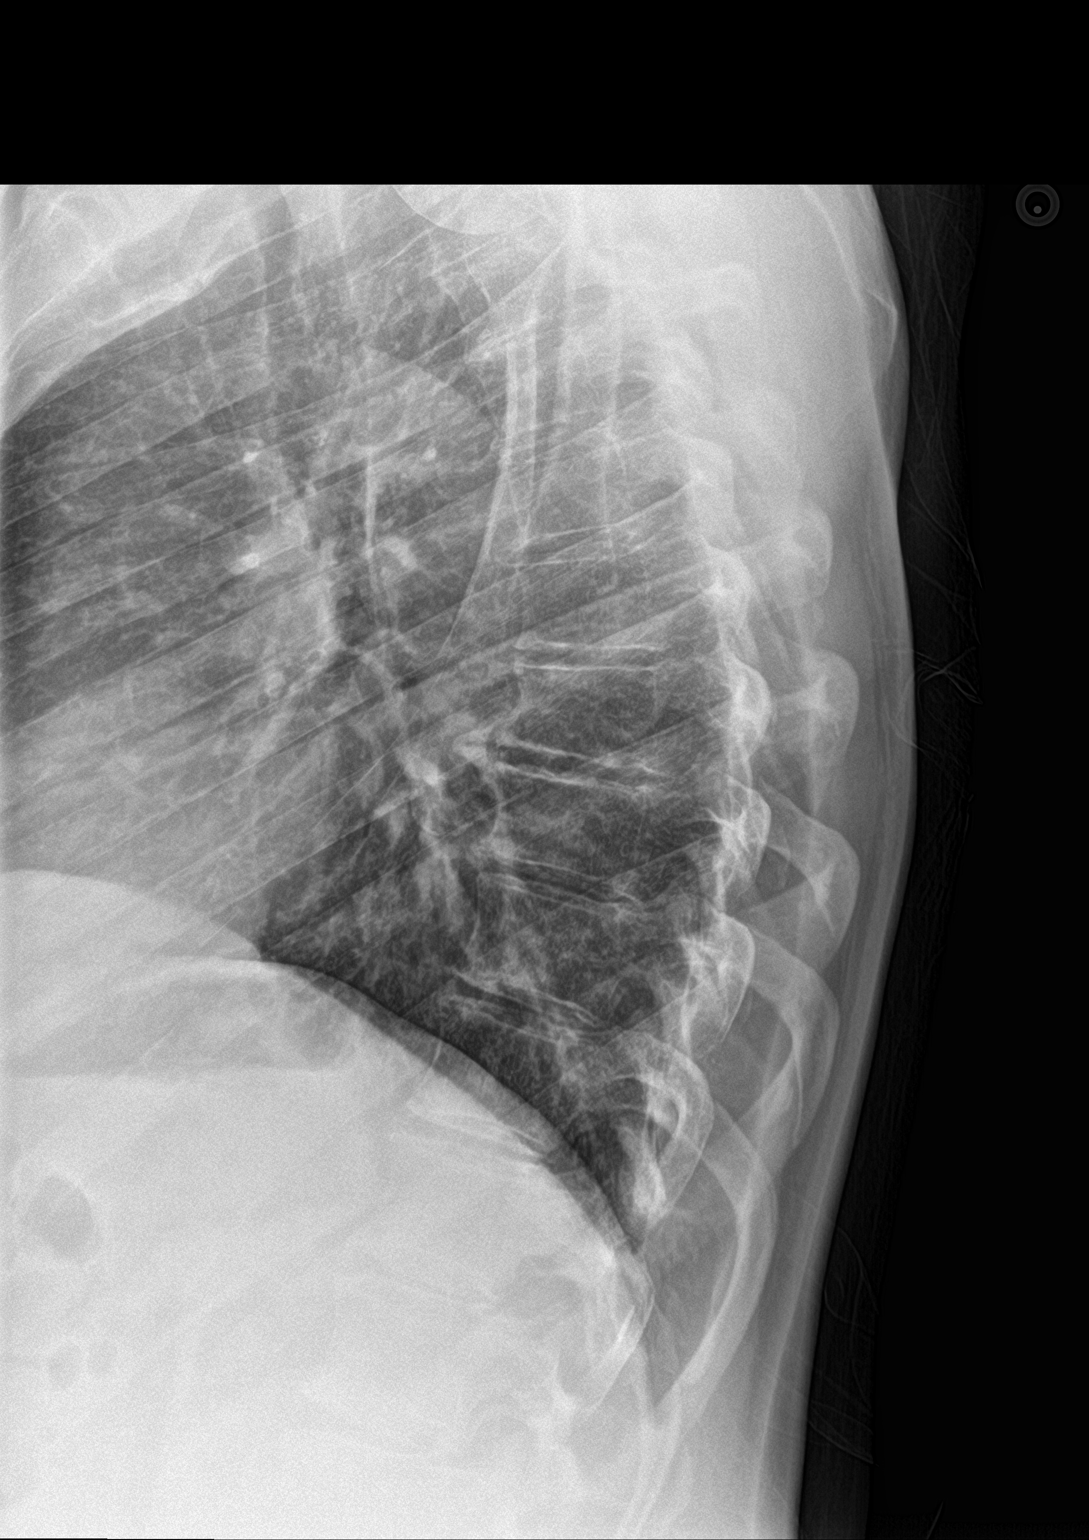

[t-spine swimmers]
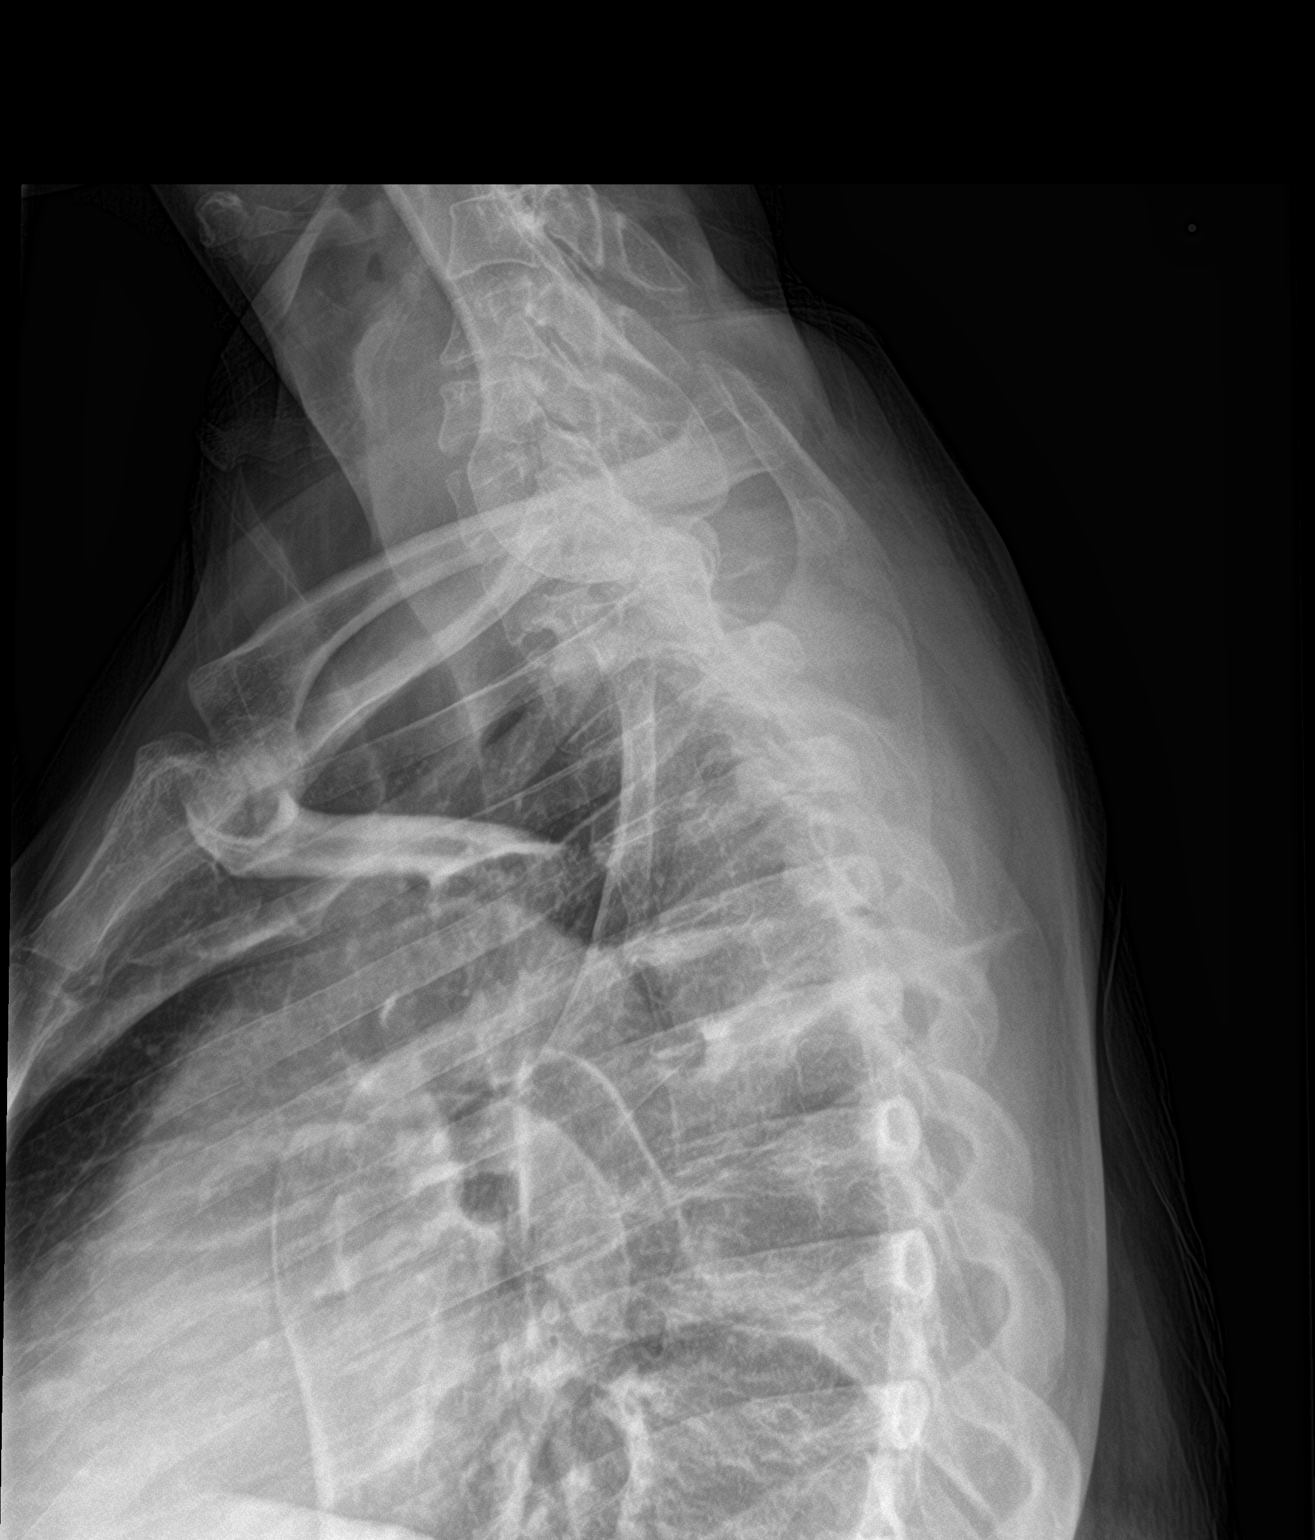

[3 of 3 positions shown; findings below may reference images not displayed]

FINDINGS: Mild scoliosis. Sagittal alignment within normal limits. Minimal
wedging T8 vertebra probably chronic. Degenerative osteophytes of
the mid to lower thoracic spine.
IMPRESSION: Scoliosis and degenerative changes. Probable chronic mild wedging of
T8.

## 2022-03-19 ENCOUNTER — Other Ambulatory Visit: Payer: Self-pay | Admitting: Family Medicine

## 2022-03-31 ENCOUNTER — Ambulatory Visit: Payer: Managed Care, Other (non HMO) | Admitting: Family Medicine

## 2022-03-31 ENCOUNTER — Encounter: Payer: Self-pay | Admitting: Family Medicine

## 2022-03-31 VITALS — BP 143/86 | HR 65 | Resp 16 | Ht 67.0 in | Wt 186.0 lb

## 2022-03-31 DIAGNOSIS — Z125 Encounter for screening for malignant neoplasm of prostate: Secondary | ICD-10-CM

## 2022-03-31 DIAGNOSIS — R361 Hematospermia: Secondary | ICD-10-CM

## 2022-03-31 DIAGNOSIS — M549 Dorsalgia, unspecified: Secondary | ICD-10-CM

## 2022-03-31 DIAGNOSIS — I10 Essential (primary) hypertension: Secondary | ICD-10-CM

## 2022-03-31 DIAGNOSIS — R58 Hemorrhage, not elsewhere classified: Secondary | ICD-10-CM

## 2022-03-31 DIAGNOSIS — G8929 Other chronic pain: Secondary | ICD-10-CM

## 2022-03-31 DIAGNOSIS — M546 Pain in thoracic spine: Secondary | ICD-10-CM

## 2022-03-31 DIAGNOSIS — F902 Attention-deficit hyperactivity disorder, combined type: Secondary | ICD-10-CM

## 2022-03-31 MED ORDER — CLONIDINE HCL 0.1 MG PO TABS: 0.1000 mg | ORAL_TABLET | Freq: Two times a day (BID) | ORAL | 0 refills | Status: DC

## 2022-03-31 MED ORDER — AMPHETAMINE-DEXTROAMPHETAMINE 10 MG PO TABS: 10.0000 mg | ORAL_TABLET | Freq: Two times a day (BID) | ORAL | 0 refills | Status: DC

## 2022-03-31 MED ORDER — CYCLOBENZAPRINE HCL 10 MG PO TABS: 5.0000 mg | ORAL_TABLET | Freq: Three times a day (TID) | ORAL | 1 refills | Status: DC | PRN

## 2022-03-31 NOTE — Assessment & Plan Note (Addendum)
Robert Aguilar holding his Adderall for a week to see if it could be contributing to his elevated blood pressure.  He said he did hold it for a couple days at 1 point but did not notice a big difference.  In the meantime we did discuss options if were just shooting for blood pressure control clonidine is not the most optimal but since he wants to also try it for is ADD I think it would be reasonable option so we will start with 0.1 mg.  Continue with other medications as well.  He also started exercising in the last couple weeks so hopefully that will make a big improvement as well. ?

## 2022-03-31 NOTE — Assessment & Plan Note (Signed)
We discussed options I think it would be reasonable to try clonidine if he would like in place of the Adderall.  He wants to overlap them at least temporarily so I did decrease his Adderall dose down to a 10 mg tab and then maybe try to come off of it and see if he is able to control his symptoms with the clonidine.  We also talked about just holding his Adderall completely for a week and monitoring his blood pressure to see if that might actually be causing the bump up in pressure. ?

## 2022-03-31 NOTE — Assessment & Plan Note (Signed)
Filled his Flexeril today.  Continue to be active and increased regular exercise. ?

## 2022-03-31 NOTE — Progress Notes (Signed)
? ?Established Patient Office Visit ? ?Subjective   ?Patient ID: Robert Aguilar, male    DOB: November 15, 1971  Age: 51 y.o. MRN: [id number] ? ?Chief Complaint  ?Patient presents with  ? Hypertension  ?  Follow up. Patient stated his BP is still running high. Cardio increased the Coreg to 12.5mg  daily.   ? Blood in semen  ?  1 month. Patient would like to discuss if Trazodone and Rexulti could be causing the blood in his semen.   ? ADHD  ?  Follow up. Patient would like to discuss clonidine to help with ADHD and BP. Patient currently taking Adderall 10 mg in the am and 5 mg in the afternoon.   ? ? ?HPI ? ?Hypertension- Pt denies chest pain, SOB, dizziness, or heart palpitations.  Taking meds as directed w/o problems.  Denies medication side effects.  Patient stated his BP is still running high. Cardio increased the Coreg to 12.5mg  daily.  Blood pressures have been running in the 150s and 160s at home though he did have 1 day where it was low as 109/72.  In fact he checked it a couple times later that day just to confirm.  But he says he has not been able to recreate that day but it did go down after exercise he is just started exercising again over the last couple of weeks.  He knows that makes a big difference in controlling his blood pressure levels.  Cardiology had suggested possibly adding amlodipine if he was still not well controlled but he is personally interested in clonidine he is wondering if it might be helpful for his ADD and might be able to come off of the Adderall.  He is concerned that since starting the Adderall his blood pressures have been gradually increasing he was wondering if it could help with his blood pressure and ADD. ? ?ADD - Reports symptoms are well controlled on current regime. Denies any problems with insomnia, chest pain, palpitations, or SOB.  Follow up. Patient would like to discuss clonidine to help with ADHD and BP. Patient currently taking Adderall 10 mg in the am and 5 mg in the  afternoon. He is concerned that since starting the Adderall his blood pressures have been gradually increasing.  ? ?He is also noted some blood in his semen.  He says that the Adderall seems to cause some hyper arousal and so he has been masturbating more often and has noticed blood recently he wonders if that could be the cause.  He is not having any blood or other symptoms he feels like he is urinating normally no blood in the urine.  He has not had any difficulty with stopping or starting the urine flow. ? ?Since exercising more his backs been bothering him a little bit more so he would like a refill on his Flexeril. ? ? ? ?ROS ? ?  ?Objective:  ?  ? ?BP (!) 143/86   Pulse 65   Resp 16   Ht 5\' 7"  (1.702 m)   Wt 186 lb (84.4 kg)   SpO2 100%   BMI 29.13 kg/m?  ? ? ?Physical Exam ?Constitutional:   ?   Appearance: He is well-developed.  ?HENT:  ?   Head: Normocephalic and atraumatic.  ?Cardiovascular:  ?   Rate and Rhythm: Normal rate and regular rhythm.  ?   Heart sounds: Normal heart sounds.  ?Pulmonary:  ?   Effort: Pulmonary effort is normal.  ?   Breath  sounds: Normal breath sounds.  ?Skin: ?   General: Skin is warm and dry.  ?Neurological:  ?   Mental Status: He is alert and oriented to person, place, and time.  ?Psychiatric:     ?   Behavior: Behavior normal.  ? ? ? ?No results found for any visits on 03/31/22. ? ? ? ?The 10-year ASCVD risk score (Arnett DK, et al., 2019) is: 6% ? ?  ?Assessment & Plan:  ? ?Problem List Items Addressed This Visit   ? ?  ? Cardiovascular and Mediastinum  ? Essential hypertension, benign  ?  Gust holding his Adderall for a week to see if it could be contributing to his elevated blood pressure.  He said he did hold it for a couple days at 1 point but did not notice a big difference.  In the meantime we did discuss options if were just shooting for blood pressure control clonidine is not the most optimal but since he wants to also try it for is ADD I think it would be  reasonable option so we will start with 0.1 mg.  Continue with other medications as well.  He also started exercising in the last couple weeks so hopefully that will make a big improvement as well. ? ?  ?  ? Relevant Medications  ? cloNIDine (CATAPRES) 0.1 MG tablet  ?  ? Other  ? Thoracic back pain  ?  Filled his Flexeril today.  Continue to be active and increased regular exercise. ? ?  ?  ? Relevant Medications  ? cyclobenzaprine (FLEXERIL) 10 MG tablet  ? ADHD  ?  We discussed options I think it would be reasonable to try clonidine if he would like in place of the Adderall.  He wants to overlap them at least temporarily so I did decrease his Adderall dose down to a 10 mg tab and then maybe try to come off of it and see if he is able to control his symptoms with the clonidine.  We also talked about just holding his Adderall completely for a week and monitoring his blood pressure to see if that might actually be causing the bump up in pressure. ? ?  ?  ? ?Other Visit Diagnoses   ? ? Screening for prostate cancer    -  Primary  ? Relevant Orders  ? PSA  ? INR/PT  ? Other acute back pain      ? Relevant Medications  ? cyclobenzaprine (FLEXERIL) 10 MG tablet  ? Other Relevant Orders  ? PSA  ? INR/PT  ? Bleeding      ? Relevant Orders  ? PSA  ? INR/PT  ? Blood in semen      ? ?  ? ? ?Blood in semen-certainly could be from increased masturbation.  Encouraged him to decrease frequency and see if that improves he says he has not noticed any blood in the last week.  If it continues to recur then I would like to refer him to urology for further work-up.  We will check a PT/INR as well as platelet level he had also noticed that he bled very easily after the last time he gave blood. ? ?Check a screening PSA. ? ?Return in about 6 weeks (around 05/12/2022) for Hypertension.  ? ? ?Robert Gasser, MD ? ?

## 2022-04-01 LAB — PSA: PSA: 0.81 ng/mL (ref <=4.00)

## 2022-04-01 LAB — PROTIME-INR
INR: 1
Prothrombin Time: 10.3 s (ref 9.0–11.5)

## 2022-04-01 NOTE — Progress Notes (Signed)
Binh, prostate test looks great.  PT/INR which measures how thick and thin the blood is also looks normal which is also reassuring.

## 2022-04-11 ENCOUNTER — Encounter: Payer: Self-pay | Admitting: Family Medicine

## 2022-04-14 MED ORDER — LOSARTAN POTASSIUM-HCTZ 50-12.5 MG PO TABS: 1.0000 | ORAL_TABLET | Freq: Every day | ORAL | 0 refills | Status: DC

## 2022-04-14 NOTE — Addendum Note (Signed)
Addended by: Nani Gasser D on: 04/14/2022 07:56 AM ? ? Modules accepted: Orders ? ?

## 2022-04-14 NOTE — Progress Notes (Signed)
Meds ordered this encounter  ?Medications  ? losartan-hydrochlorothiazide (HYZAAR) 50-12.5 MG tablet  ?  Sig: Take 1 tablet by mouth daily.  ?  Dispense:  90 tablet  ?  Refill:  0  ? ? ?

## 2022-04-20 ENCOUNTER — Other Ambulatory Visit: Payer: Self-pay | Admitting: Family Medicine

## 2022-05-05 ENCOUNTER — Ambulatory Visit: Payer: Managed Care, Other (non HMO) | Admitting: Family Medicine

## 2022-05-05 ENCOUNTER — Telehealth: Payer: Self-pay | Admitting: Family Medicine

## 2022-05-05 ENCOUNTER — Encounter: Payer: Self-pay | Admitting: Family Medicine

## 2022-05-05 VITALS — BP 121/77 | HR 59 | Ht 67.0 in | Wt 181.0 lb

## 2022-05-05 DIAGNOSIS — I10 Essential (primary) hypertension: Secondary | ICD-10-CM | POA: Diagnosis not present

## 2022-05-05 DIAGNOSIS — F902 Attention-deficit hyperactivity disorder, combined type: Secondary | ICD-10-CM | POA: Diagnosis not present

## 2022-05-05 DIAGNOSIS — N529 Male erectile dysfunction, unspecified: Secondary | ICD-10-CM

## 2022-05-05 MED ORDER — LOSARTAN POTASSIUM 25 MG PO TABS: 25.0000 mg | ORAL_TABLET | Freq: Every day | ORAL | 0 refills | Status: DC

## 2022-05-05 MED ORDER — CLONIDINE HCL 0.2 MG PO TABS: 0.2000 mg | ORAL_TABLET | Freq: Two times a day (BID) | ORAL | 2 refills | Status: DC

## 2022-05-05 NOTE — Telephone Encounter (Signed)
Call pharmacy.  He reports that he is not getting the 10 tabs of Cialis monthly that they are giving him less.  Please see if it is a quantity limit or if we just need to update his prescription.

## 2022-05-05 NOTE — Telephone Encounter (Signed)
Called pharmacy and was informed that insurance would only cover 6 tablets per month.  However, he is now using a voucher for this medication and the pharmacy neglected to change the quantity back to 10 tablets per month.  Pharmacy states that they corrected this problem while I was on the phone with them.  Pt informed.  Tiajuana Amass, CMA

## 2022-05-05 NOTE — Assessment & Plan Note (Signed)
We will increase clonidine to 0.2 mg with the goal of trying to get him completely off of Adderall since he does have significant appetite suppression.F/U in 2 months.

## 2022-05-05 NOTE — Assessment & Plan Note (Signed)
Blood pressure at goal in fact he has been getting some lower pressures at home so we discussed discontinuing the HCTZ component of his medication and decreasing the losartan down to 25 mg.  New prescription sent to pharmacy.  Follow-up in 2 months.

## 2022-05-05 NOTE — Progress Notes (Signed)
Established Patient Office Visit  Subjective   Patient ID: Robert Aguilar, male    DOB: [birth year]  Age: 51 y.o. MRN: [id number]  No chief complaint on file.   HPI F/U ADHD  - we have been trying to transition to the clonidine and off the adderall.  Says BP initially dipped initially.  Seems to have leveled off but it still been on the lower end.  He does feel like the clonidine 0.1 mg has been helpful but he still been taking half a tab of his Adderall in the morning and a half in the evening but he has noticed that it has been giving him some headaches even on 5 mg and still suppressing his appetite.  He did hold the medication for about a week.  HTN -because of the addition of the clonidine we also decreased his losartan to 50/12.5 mg. Also on Coreg.   ED - also asked about Cialis. Says the pharmacy is not giving him the full 10 tabs each time he fills. It.     ROS    Objective:     BP 121/77   Pulse (!) 59   Ht 5\' 7"  (1.702 m)   Wt 181 lb (82.1 kg)   SpO2 99%   BMI 28.35 kg/m    Physical Exam Constitutional:      Appearance: He is well-developed.  HENT:     Head: Normocephalic and atraumatic.  Cardiovascular:     Rate and Rhythm: Normal rate and regular rhythm.     Heart sounds: Normal heart sounds.  Pulmonary:     Effort: Pulmonary effort is normal.     Breath sounds: Normal breath sounds.  Skin:    General: Skin is warm and dry.  Neurological:     Mental Status: He is alert and oriented to person, place, and time.  Psychiatric:        Behavior: Behavior normal.     No results found for any visits on 05/05/22.    The 10-year ASCVD risk score (Arnett DK, et al., 2019) is: 4.5%    Assessment & Plan:   Problem List Items Addressed This Visit       Cardiovascular and Mediastinum   Essential hypertension, benign    Blood pressure at goal in fact he has been getting some lower pressures at home so we discussed discontinuing the HCTZ component of his  medication and decreasing the losartan down to 25 mg.  New prescription sent to pharmacy.  Follow-up in 2 months.       Relevant Medications   carvedilol (COREG) 12.5 MG tablet   cloNIDine (CATAPRES) 0.2 MG tablet   losartan (COZAAR) 25 MG tablet     Other   Erectile dysfunction    Unclear why the insurance is not feeling 10 tabs per month.  We will need to call the pharmacy to verify.  We discussed that there certainly could be a quantity limit in place by his insurance company so he may want to reach out to them as well.       ADHD - Primary    We will increase clonidine to 0.2 mg with the goal of trying to get him completely off of Adderall since he does have significant appetite suppression.F/U in 2 months.         Relevant Medications   cloNIDine (CATAPRES) 0.2 MG tablet    Return in about 2 months (around 07/05/2022) for change in medication, BP check.  Constancia Geeting, MD  

## 2022-05-05 NOTE — Assessment & Plan Note (Signed)
Unclear why the insurance is not feeling 10 tabs per month.  We will need to call the pharmacy to verify.  We discussed that there certainly could be a quantity limit in place by his insurance company so he may want to reach out to them as well.

## 2022-05-19 ENCOUNTER — Other Ambulatory Visit: Payer: Self-pay | Admitting: Family Medicine

## 2022-06-22 ENCOUNTER — Other Ambulatory Visit: Payer: Self-pay | Admitting: Family Medicine

## 2022-06-22 DIAGNOSIS — I10 Essential (primary) hypertension: Secondary | ICD-10-CM

## 2022-06-23 ENCOUNTER — Other Ambulatory Visit: Payer: Self-pay | Admitting: Family Medicine

## 2022-06-23 DIAGNOSIS — I10 Essential (primary) hypertension: Secondary | ICD-10-CM

## 2022-06-29 ENCOUNTER — Other Ambulatory Visit: Payer: Self-pay | Admitting: Family Medicine

## 2022-07-20 ENCOUNTER — Telehealth (INDEPENDENT_AMBULATORY_CARE_PROVIDER_SITE_OTHER): Payer: Managed Care, Other (non HMO) | Admitting: Family Medicine

## 2022-07-20 VITALS — BP 135/84 | HR 82 | Ht 69.0 in | Wt 184.0 lb

## 2022-07-20 DIAGNOSIS — F902 Attention-deficit hyperactivity disorder, combined type: Secondary | ICD-10-CM | POA: Diagnosis not present

## 2022-07-20 DIAGNOSIS — I10 Essential (primary) hypertension: Secondary | ICD-10-CM | POA: Diagnosis not present

## 2022-07-20 DIAGNOSIS — F4323 Adjustment disorder with mixed anxiety and depressed mood: Secondary | ICD-10-CM

## 2022-07-20 MED ORDER — LOSARTAN POTASSIUM 50 MG PO TABS: 50.0000 mg | ORAL_TABLET | Freq: Every day | ORAL | 3 refills | Status: DC

## 2022-07-20 MED ORDER — LOSARTAN POTASSIUM 50 MG PO TABS: 50.0000 mg | ORAL_TABLET | Freq: Every day | ORAL | 0 refills | Status: DC

## 2022-07-20 MED ORDER — GUANFACINE HCL ER 1 MG PO TB24: 1.0000 mg | ORAL_TABLET | Freq: Every day | ORAL | 0 refills | Status: DC

## 2022-07-20 NOTE — Progress Notes (Signed)
Virtual Visit via Video Note  I connected with Robert Aguilar on 07/20/22 at  1:00 PM EDT by a video enabled telemedicine application and verified that I am speaking with the correct person using two identifiers.   I discussed the limitations of evaluation and management by telemedicine and the availability of in person appointments. The patient expressed understanding and agreed to proceed.  Patient location: at home Provider location: in office  Subjective:    CC:  No chief complaint on file.   HPI:  F/U BP - Home BPs 118-140s.  HR 68-74. Clonidine does make him very tired but helps with the mental slowdown so mind is not racing so much. He wanted to try this to seee if helped with the ADD and BP.  He is taking 1.5 in AM and 1mg  in PM.  When he tried to to take a higher dose noticed inc in HR>    Saw [facility] provider about 3-4 weeks ago. They discussed trial off the Rexulti and didn't do well after about 2.5 weeks so had to restart it.   Still taking Adderal 10mg  once a day in the AM.    Past medical history, Surgical history, Family history not pertinant except as noted below, Social history, Allergies, and medications have been entered into the medical record, reviewed, and corrections made.    Objective:    General: Speaking clearly in complete sentences without any shortness of breath.  Alert and oriented x3.  Normal judgment. No apparent acute distress.    Impression and Recommendations:    Problem List Items Addressed This Visit       Cardiovascular and Mediastinum   Essential hypertension, benign    Blood pressures have been well-controlled but we are going to discontinue the clonidine because of fatigue.  He would like to try Intuniv instead.  Can adjust dose over time if needed.  Also consider switching to a calcium channel blocker.      Relevant Medications   losartan (COZAAR) 50 MG tablet     Other   Attention deficit hyperactivity disorder (ADHD), combined  type - Primary   Adjustment disorder with mixed anxiety and depressed mood    Has continued to stay on a low-dose of 10 mg Adderall in the mornings.  Will discontinue clonidine and try Intuniv.  Follow-up in 4 to 8 weeks.  Send me a MyChart note after 3 to 4 weeks and see if we may need to go up on the dose to 2 mg.       No orders of the defined types were placed in this encounter.   Meds ordered this encounter  Medications   DISCONTD: losartan (COZAAR) 50 MG tablet    Sig: Take 1 tablet (50 mg total) by mouth daily.    Dispense:  1 tablet    Refill:  0   guanFACINE (INTUNIV) 1 MG TB24 ER tablet    Sig: Take 1 tablet (1 mg total) by mouth daily.    Dispense:  30 tablet    Refill:  0   losartan (COZAAR) 50 MG tablet    Sig: Take 1 tablet (50 mg total) by mouth daily.    Dispense:  90 tablet    Refill:  3     I discussed the assessment and treatment plan with the patient. The patient was provided an opportunity to ask questions and all were answered. The patient agreed with the plan and demonstrated an understanding of the instructions.  The patient was advised to call back or seek an in-person evaluation if the symptoms worsen or if the condition fails to improve as anticipated.  I spent 21 minutes on the day of the encounter to include pre-visit record review, face-to-face time with the patient and post visit ordering of test.   Nani Gasser, MD

## 2022-07-20 NOTE — Progress Notes (Signed)
Pt states he would like to talk about his bp and gets dizzy when he's standing ,he feels  like he's having low pressure but when he checks it at home it's normal, pt states  he  would like to discuss his losartan

## 2022-07-20 NOTE — Assessment & Plan Note (Signed)
Blood pressures have been well-controlled but we are going to discontinue the clonidine because of fatigue.  He would like to try Intuniv instead.  Can adjust dose over time if needed.  Also consider switching to a calcium channel blocker.

## 2022-07-20 NOTE — Assessment & Plan Note (Signed)
Has continued to stay on a low-dose of 10 mg Adderall in the mornings.  Will discontinue clonidine and try Intuniv.  Follow-up in 4 to 8 weeks.  Send me a MyChart note after 3 to 4 weeks and see if we may need to go up on the dose to 2 mg.

## 2022-08-07 ENCOUNTER — Encounter: Payer: Self-pay | Admitting: Family Medicine

## 2022-08-11 MED ORDER — GUANFACINE HCL ER 2 MG PO TB24: 2.0000 mg | ORAL_TABLET | Freq: Every day | ORAL | 1 refills | Status: DC

## 2022-08-24 ENCOUNTER — Other Ambulatory Visit: Payer: Self-pay | Admitting: Family Medicine

## 2022-08-24 DIAGNOSIS — F902 Attention-deficit hyperactivity disorder, combined type: Secondary | ICD-10-CM

## 2022-08-25 ENCOUNTER — Encounter: Payer: Self-pay | Admitting: Family Medicine

## 2022-08-25 ENCOUNTER — Other Ambulatory Visit: Payer: Self-pay | Admitting: Family Medicine

## 2022-08-26 MED ORDER — LISINOPRIL 20 MG PO TABS: 20.0000 mg | ORAL_TABLET | Freq: Every day | ORAL | 0 refills | Status: DC

## 2022-08-26 NOTE — Telephone Encounter (Signed)
I have not heard of any back orders for losartan so this is unusual.  Hopefully that will help lisinopril.  New prescription sent.  Encouraged him to follow-up in a few weeks after he switch his blood pressure medications we can always do a nurse visit if needed.

## 2022-09-02 ENCOUNTER — Other Ambulatory Visit: Payer: Self-pay | Admitting: Family Medicine

## 2022-09-09 ENCOUNTER — Encounter: Payer: Self-pay | Admitting: Family Medicine

## 2022-11-11 ENCOUNTER — Ambulatory Visit: Payer: Managed Care, Other (non HMO) | Admitting: Family Medicine

## 2022-11-11 ENCOUNTER — Encounter: Payer: Self-pay | Admitting: Family Medicine

## 2022-11-11 VITALS — BP 198/108 | HR 68 | Ht 67.0 in | Wt 186.0 lb

## 2022-11-11 DIAGNOSIS — F902 Attention-deficit hyperactivity disorder, combined type: Secondary | ICD-10-CM

## 2022-11-11 DIAGNOSIS — I10 Essential (primary) hypertension: Secondary | ICD-10-CM

## 2022-11-11 MED ORDER — AMLODIPINE BESYLATE 5 MG PO TABS: 5.0000 mg | ORAL_TABLET | Freq: Every day | ORAL | 0 refills | Status: DC

## 2022-11-11 MED ORDER — VALSARTAN 320 MG PO TABS: 320.0000 mg | ORAL_TABLET | Freq: Every day | ORAL | 1 refills | Status: DC

## 2022-11-11 NOTE — Assessment & Plan Note (Signed)
Please discontinue lisinopril and we will switch to valsartan 120 mg once a day. Okay to start amlodipine today if you would like. Blood pressures with you at next visit.

## 2022-11-11 NOTE — Patient Instructions (Addendum)
Please discontinue lisinopril and we will switch to valsartan 120 mg once a day. Okay to start amlodipine today if you would like. Blood pressures with you at next visit. 

## 2022-11-11 NOTE — Progress Notes (Signed)
   Established Patient Office Visit  Subjective   Patient ID: Robert Aguilar, male    DOB: [birth year]  Age: 51 y.o. MRN: [id number]  Chief Complaint  Patient presents with   Hypertension    HPI Hypertension- Pt denies chest pain, SOB, dizziness, or heart palpitations.  Taking meds as directed w/o problems.  Denies medication side effects.  Brought in home BP cuff today to compare. Increase lisinopril ot 30mg  about a week ago. If BP is > 160 then will get a HA and some jaw pain. Takes Tylenol for the HA.  Robert Aguilar is taking the Adderall and feels it weaning off.  Sees pych in about 2 weeks.  Has been trying to dec salt and has been getting back into walking.    ADD - Reports symptoms are well controlled on current regime. Denies any problems with insomnia, chest pain, palpitations, or SOB.      ROS    Objective:     BP (!) 198/108   Pulse 68   Ht 5\' 7"  (1.702 m)   Wt 186 lb (84.4 kg)   SpO2 100%   BMI 29.13 kg/m    Physical Exam Constitutional:      Appearance: Robert Aguilar is well-developed.  HENT:     Head: Normocephalic and atraumatic.  Cardiovascular:     Rate and Rhythm: Normal rate and regular rhythm.     Heart sounds: Normal heart sounds.  Pulmonary:     Effort: Pulmonary effort is normal.     Breath sounds: Normal breath sounds.  Skin:    General: Skin is warm and dry.  Neurological:     Mental Status: Robert Aguilar is alert and oriented to person, place, and time.  Psychiatric:        Behavior: Behavior normal.      No results found for any visits on 11/11/22.    The 10-year ASCVD risk score (Arnett DK, et al., 2019) is: 11.6%    Assessment & Plan:   Problem List Items Addressed This Visit       Cardiovascular and Mediastinum   Essential hypertension, benign - Primary    Please discontinue lisinopril and we will switch to valsartan 120 mg once a day. Okay to start amlodipine today if you would like. Blood pressures with you at next visit.      Relevant Medications    valsartan (DIOVAN) 320 MG tablet   amLODipine (NORVASC) 5 MG tablet     Other   Attention deficit hyperactivity disorder (ADHD), combined type    Currently on Adderall 10 mg twice a day via psychiatry.  It certainly could be impacting the blood pressure but I will let him discuss with psychiatry in the meantime for discomfort on try to get the blood pressure under control.  Okay to go ahead and taper off of the intuniv.       Return in about 6 weeks (around 12/23/2022) for Hypertension, nurse visit .    2020, MD

## 2022-11-11 NOTE — Assessment & Plan Note (Signed)
Currently on Adderall 10 mg twice a day via psychiatry.  It certainly could be impacting the blood pressure but I will let him discuss with psychiatry in the meantime for discomfort on try to get the blood pressure under control.  Okay to go ahead and taper off of the intuniv.

## 2022-11-11 NOTE — Progress Notes (Signed)
Pt brought in home bp cuff it was 196/129 P:71.  He stated that he increased the Lisinopril 20 mg to 30 mg 1 wk ago because his bp was running in the 150's.

## 2022-12-08 ENCOUNTER — Encounter: Payer: Self-pay | Admitting: Family Medicine

## 2022-12-08 DIAGNOSIS — I10 Essential (primary) hypertension: Secondary | ICD-10-CM

## 2022-12-09 MED ORDER — AMLODIPINE BESYLATE 10 MG PO TABS: 10.0000 mg | ORAL_TABLET | Freq: Every day | ORAL | 3 refills | Status: DC

## 2022-12-23 ENCOUNTER — Ambulatory Visit (INDEPENDENT_AMBULATORY_CARE_PROVIDER_SITE_OTHER): Payer: Managed Care, Other (non HMO) | Admitting: Family Medicine

## 2022-12-23 VITALS — BP 146/94 | HR 73 | Ht 67.0 in | Wt 191.0 lb

## 2022-12-23 DIAGNOSIS — I10 Essential (primary) hypertension: Secondary | ICD-10-CM

## 2022-12-23 NOTE — Progress Notes (Signed)
BP still elevated today but much better than Last OV> Home BPs at goal.  Continue current regimen and f/U in 2 mo with PCP

## 2022-12-23 NOTE — Progress Notes (Signed)
   Subjective:    Patient ID: Robert Aguilar, male    DOB: 1971/10/06, 52 y.o.   MRN: [id number]  HPI Pt here for bp check   Review of Systems     Objective:   Physical Exam        Assessment & Plan:   Pts second bp was 146/93, p 71, pt states he has white coat syndrome and gets normal readings at home of 120's over 80's, pt denies any missed doses. Pt advised to continue monitoring bp at home and follow up in 8 weeks with pcp.

## 2023-02-17 ENCOUNTER — Ambulatory Visit: Payer: Managed Care, Other (non HMO) | Admitting: Family Medicine

## 2023-02-17 ENCOUNTER — Encounter: Payer: Self-pay | Admitting: Family Medicine

## 2023-02-17 ENCOUNTER — Other Ambulatory Visit: Payer: Self-pay | Admitting: Family Medicine

## 2023-02-17 VITALS — BP 136/81 | HR 72 | Ht 67.0 in | Wt 193.0 lb

## 2023-02-17 DIAGNOSIS — I10 Essential (primary) hypertension: Secondary | ICD-10-CM

## 2023-02-17 DIAGNOSIS — F902 Attention-deficit hyperactivity disorder, combined type: Secondary | ICD-10-CM

## 2023-02-17 MED ORDER — OLMESARTAN-AMLODIPINE-HCTZ 40-10-12.5 MG PO TABS: 1.0000 | ORAL_TABLET | Freq: Every day | ORAL | 1 refills | Status: DC

## 2023-02-17 MED ORDER — HYDROCHLOROTHIAZIDE 12.5 MG PO CAPS: 12.5000 mg | ORAL_CAPSULE | Freq: Every day | ORAL | 3 refills | Status: DC

## 2023-02-17 MED ORDER — AMLODIPINE-OLMESARTAN 10-40 MG PO TABS: 1.0000 | ORAL_TABLET | Freq: Every day | ORAL | 3 refills | Status: DC

## 2023-02-17 NOTE — Assessment & Plan Note (Signed)
Being prescribed by psychiatry he is on 10 mg twice a day and that seems to be helping.  Interestingly he did quit vaping and says he noticed that this sensation when he takes in her Adderall has changed before he could tell it kicking in and now he does not feel that sensation.

## 2023-02-17 NOTE — Assessment & Plan Note (Signed)
Pressure much improved today.  He says he still getting low 130s at home as well as we discussed additional blood pressure medication to get his blood pressure consistently into the 120s.  Will discontinue valsartan and amlodipine and switch to Tribenzor.  New prescription sent to pharmacy.

## 2023-02-17 NOTE — Telephone Encounter (Signed)
Left message advising patient of the new prescriptions.

## 2023-02-17 NOTE — Telephone Encounter (Signed)
Please call patient and let them know that they did not cover the Tribenzor and the pharmacy notified us.  So I am going to switch him to Azor plus hydrochlorothiazide.  Meds ordered this encounter  Medications   amLODipine-olmesartan (AZOR) 10-40 MG tablet    Sig: Take 1 tablet by mouth daily.    Dispense:  90 tablet    Refill:  3   hydrochlorothiazide (MICROZIDE) 12.5 MG capsule    Sig: Take 1 capsule (12.5 mg total) by mouth daily.    Dispense:  90 capsule    Refill:  3

## 2023-02-17 NOTE — Progress Notes (Signed)
   Established Patient Office Visit  Subjective   Patient ID: Robert Aguilar, male    DOB: Jan 22, 1971  Age: 52 y.o. MRN: [id number]  Chief Complaint  Patient presents with   Hypertension    HPI  Hypertension- Pt denies chest pain, SOB, dizziness, or heart palpitations.  Taking meds as directed w/o problems.  Denies medication side effects.  Came back in for repeat blood pressure check in January and it was still elevated at 146/94 but reported that he been getting great blood pressures at home.  Currently on amlodipine Lucianne Lei.  ADD - Reports symptoms are well controlled on current regime. Denies any problems with insomnia, chest pain, palpitations, or SOB.    He has quit vaping and is currently on the 14 mg nicotine patch occasionally uses gum to help as well.  He has been snacking on little bit more candy and so has gained a little bit of weight but also recently started back on E2M to work on diet and getting back into routine exercise.  He is been working out 3 days/week.    ROS    Objective:     BP 136/81 (BP Location: Left Arm, Cuff Size: Normal)   Pulse 72   Ht '5\' 7"'$  (1.702 m)   Wt 193 lb (87.5 kg)   SpO2 100%   BMI 30.23 kg/m    Physical Exam Constitutional:      Appearance: He is well-developed.  HENT:     Head: Normocephalic and atraumatic.  Cardiovascular:     Rate and Rhythm: Normal rate and regular rhythm.     Heart sounds: Normal heart sounds.  Pulmonary:     Effort: Pulmonary effort is normal.     Breath sounds: Normal breath sounds.  Skin:    General: Skin is warm and dry.  Neurological:     Mental Status: He is alert and oriented to person, place, and time.  Psychiatric:        Behavior: Behavior normal.      No results found for any visits on 02/17/23.    The 10-year ASCVD risk score (Arnett DK, et al., 2019) is: 6.1%    Assessment & Plan:   Problem List Items Addressed This Visit       Cardiovascular and Mediastinum   Essential  hypertension, benign - Primary    Pressure much improved today.  He says he still getting low 130s at home as well as we discussed additional blood pressure medication to get his blood pressure consistently into the 120s.  Will discontinue valsartan and amlodipine and switch to Tribenzor.  New prescription sent to pharmacy.      Relevant Medications   Olmesartan-amLODIPine-HCTZ 40-10-12.5 MG TABS     Other   Attention deficit hyperactivity disorder (ADHD), combined type    Being prescribed by psychiatry he is on 10 mg twice a day and that seems to be helping.  Interestingly he did quit vaping and says he noticed that this sensation when he takes in her Adderall has changed before he could tell it kicking in and now he does not feel that sensation.       Return in about 2 months (around 04/19/2023) for Hypertension and fasting labs .    Beatrice Lecher, MD

## 2023-06-30 ENCOUNTER — Telehealth: Payer: Self-pay | Admitting: Family Medicine

## 2023-06-30 NOTE — Telephone Encounter (Signed)
Patient called to request refills on carvedilo 12.5 mg  [facility] number [phone]

## 2023-07-01 MED ORDER — CARVEDILOL 12.5 MG PO TABS: 12.5000 mg | ORAL_TABLET | Freq: Two times a day (BID) | ORAL | 0 refills | Status: DC

## 2023-07-01 NOTE — Telephone Encounter (Signed)
Medication sent.

## 2023-10-05 ENCOUNTER — Other Ambulatory Visit: Payer: Self-pay | Admitting: Family Medicine

## 2023-10-05 NOTE — Telephone Encounter (Signed)
Please call pt he is due for OV with fasting labs. Thank you.

## 2023-10-05 NOTE — Telephone Encounter (Signed)
Called patient, LVM to call office to schedule OV and fasting labs, thanks.

## 2023-10-11 ENCOUNTER — Other Ambulatory Visit: Payer: Self-pay | Admitting: Family Medicine

## 2023-10-11 ENCOUNTER — Encounter: Payer: Self-pay | Admitting: Family Medicine

## 2023-10-11 ENCOUNTER — Ambulatory Visit: Payer: Managed Care, Other (non HMO) | Admitting: Family Medicine

## 2023-10-11 VITALS — BP 124/67 | HR 94 | Ht 67.0 in | Wt 193.0 lb

## 2023-10-11 DIAGNOSIS — I10 Essential (primary) hypertension: Secondary | ICD-10-CM | POA: Diagnosis not present

## 2023-10-11 DIAGNOSIS — M546 Pain in thoracic spine: Secondary | ICD-10-CM

## 2023-10-11 DIAGNOSIS — G8929 Other chronic pain: Secondary | ICD-10-CM

## 2023-10-11 DIAGNOSIS — R5383 Other fatigue: Secondary | ICD-10-CM

## 2023-10-11 DIAGNOSIS — M5489 Other dorsalgia: Secondary | ICD-10-CM

## 2023-10-11 DIAGNOSIS — E038 Other specified hypothyroidism: Secondary | ICD-10-CM | POA: Diagnosis not present

## 2023-10-11 DIAGNOSIS — E785 Hyperlipidemia, unspecified: Secondary | ICD-10-CM

## 2023-10-11 DIAGNOSIS — G4700 Insomnia, unspecified: Secondary | ICD-10-CM

## 2023-10-11 MED ORDER — CYCLOBENZAPRINE HCL 10 MG PO TABS: 5.0000 mg | ORAL_TABLET | Freq: Three times a day (TID) | ORAL | 1 refills | Status: AC | PRN

## 2023-10-11 MED ORDER — TRAZODONE HCL 50 MG PO TABS: ORAL_TABLET | ORAL | 3 refills | Status: AC

## 2023-10-11 NOTE — Assessment & Plan Note (Signed)
RF muscle relaxer.

## 2023-10-11 NOTE — Assessment & Plan Note (Signed)
Check thyroid panel 

## 2023-10-11 NOTE — Assessment & Plan Note (Signed)
RF trazodone.

## 2023-10-11 NOTE — Progress Notes (Addendum)
   Established Patient Office Visit  Subjective   Patient ID: Robert Aguilar, male    DOB: July 15, 1971  Age: 52 y.o. MRN: [id number]  Chief Complaint  Patient presents with   Hypertension         HPI  Hypertension- Pt denies chest pain, SOB, dizziness, or heart palpitations.  Taking meds as directed w/o problems.  Denies medication side effects.    C/o of fatigue.  Would like to have testosterone levels checked as well as thyroid panel.  He does have a history of subclinical hypothyroidism.  He did try to eat really healthy for a period of time but really had significant difficulty losing weight.     ROS    Objective:     BP 124/67   Pulse 94   Ht 5\' 7"  (1.702 m)   Wt 193 lb (87.5 kg)   SpO2 100%   BMI 30.23 kg/m     Physical Exam Vitals and nursing note reviewed.  Constitutional:      Appearance: Normal appearance.  HENT:     Head: Normocephalic and atraumatic.  Eyes:     Conjunctiva/sclera: Conjunctivae normal.  Cardiovascular:     Rate and Rhythm: Normal rate and regular rhythm.  Pulmonary:     Effort: Pulmonary effort is normal.     Breath sounds: Normal breath sounds.  Skin:    General: Skin is warm and dry.  Neurological:     Mental Status: He is alert.  Psychiatric:        Mood and Affect: Mood normal.      No results found for any visits on 10/11/23.     The 10-year ASCVD risk score (Arnett DK, et al., 2019) is: 5.2%    Assessment & Plan:   Problem List Items Addressed This Visit       Cardiovascular and Mediastinum   Essential hypertension, benign - Primary    Well controlled. Continue current regimen. Follow up in  33mo       Relevant Orders   CMP14+EGFR   Lipid panel   CBC   Hemoglobin A1c   Thyroid Panel With TSH     Endocrine   Subclinical hypothyroidism    Check thyroid panel.       Relevant Orders   Thyroid Panel With TSH     Other   Thoracic back pain    RF muscle relaxer.        Relevant Medications    cyclobenzaprine (FLEXERIL) 10 MG tablet   INSOMNIA    RF trazodone.        Relevant Medications   traZODone (DESYREL) 50 MG tablet   Hyperlipidemia    Due to recheck lipids.       Other Visit Diagnoses     Other fatigue       Relevant Orders   CMP14+EGFR   Lipid panel   CBC   Hemoglobin A1c   Thyroid Panel With TSH   Testosterone, Free, Total, SHBG   Other acute back pain       Relevant Medications   cyclobenzaprine (FLEXERIL) 10 MG tablet      Colonoscopy scheduled later this year he is due for 3-year recall.  Return in about 6 months (around 04/10/2024) for Hypertension.    Nani Gasser, MD

## 2023-10-11 NOTE — Assessment & Plan Note (Signed)
Due to recheck lipids. 

## 2023-10-11 NOTE — Assessment & Plan Note (Signed)
Well controlled. Continue current regimen. Follow up in  6 mo  

## 2023-10-11 NOTE — Addendum Note (Signed)
Addended by: Nani Gasser D on: 10/11/2023 03:44 PM   Modules accepted: Orders

## 2023-10-12 ENCOUNTER — Encounter: Payer: Self-pay | Admitting: Family Medicine

## 2023-10-12 LAB — CBC
Hematocrit: 43.5 % (ref 37.5–51.0)
Hemoglobin: 14.6 g/dL (ref 13.0–17.7)
MCH: 31.5 pg (ref 26.6–33.0)
MCHC: 33.6 g/dL (ref 31.5–35.7)
MCV: 94 fL (ref 79–97)
Platelets: 250 10*3/uL (ref 150–450)
RBC: 4.63 x10E6/uL (ref 4.14–5.80)
RDW: 12 % (ref 11.6–15.4)
WBC: 6 10*3/uL (ref 3.4–10.8)

## 2023-10-12 LAB — LIPID PANEL
Chol/HDL Ratio: 5.6 ratio — ABNORMAL HIGH (ref 0.0–5.0)
Cholesterol, Total: 271 mg/dL — ABNORMAL HIGH (ref 100–199)
HDL: 48 mg/dL (ref >39)
LDL Chol Calc (NIH): 186 mg/dL — ABNORMAL HIGH (ref 0–99)
Triglycerides: 198 mg/dL — ABNORMAL HIGH (ref 0–149)
VLDL Cholesterol Cal: 37 mg/dL (ref 5–40)

## 2023-10-12 LAB — CMP14+EGFR
ALT: 26 [IU]/L (ref 0–44)
AST: 23 [IU]/L (ref 0–40)
Albumin: 5 g/dL — ABNORMAL HIGH (ref 3.8–4.9)
Alkaline Phosphatase: 77 [IU]/L (ref 44–121)
BUN/Creatinine Ratio: 14 (ref 9–20)
BUN: 15 mg/dL (ref 6–24)
Bilirubin Total: 0.4 mg/dL (ref 0.0–1.2)
CO2: 23 mmol/L (ref 20–29)
Calcium: 9.7 mg/dL (ref 8.7–10.2)
Chloride: 99 mmol/L (ref 96–106)
Creatinine, Ser: 1.06 mg/dL (ref 0.76–1.27)
Globulin, Total: 2.6 g/dL (ref 1.5–4.5)
Glucose: 84 mg/dL (ref 70–99)
Potassium: 4 mmol/L (ref 3.5–5.2)
Sodium: 140 mmol/L (ref 134–144)
Total Protein: 7.6 g/dL (ref 6.0–8.5)
eGFR: 84 mL/min/{1.73_m2} (ref >59)

## 2023-10-12 LAB — THYROID PANEL WITH TSH
Free Thyroxine Index: 1.9 (ref 1.2–4.9)
T3 Uptake Ratio: 29 % (ref 24–39)
T4, Total: 6.5 ug/dL (ref 4.5–12.0)
TSH: 5.03 u[IU]/mL — ABNORMAL HIGH (ref 0.450–4.500)

## 2023-10-12 LAB — HEMOGLOBIN A1C
Est. average glucose Bld gHb Est-mCnc: 114 mg/dL
Hgb A1c MFr Bld: 5.6 % (ref 4.8–5.6)

## 2023-10-13 ENCOUNTER — Encounter: Payer: Self-pay | Admitting: Family Medicine

## 2023-10-13 DIAGNOSIS — E291 Testicular hypofunction: Secondary | ICD-10-CM | POA: Insufficient documentation

## 2023-10-13 NOTE — Telephone Encounter (Signed)
Please call labs and see if we can add TPO level

## 2023-10-13 NOTE — Progress Notes (Signed)
Hi Robert Aguilar, total testosterone is low.  Are you wanting to consider testosterone replacement therapy?  If so we can talk about options metabolic panel overall looks good.  Triglycerides and LDL are very high.  10-year cardiovascular risk score is around 7.8.  So for now just really encouraged her to work on healthy diet and regular exercise to improve those numbers.  No sign of anemia.  A1c is in the normal range but it is up from your previous so again just continue to work on healthy diet.  Also thyroid has drifted up just slightly so we will call the lab and see if we can add on a free T4.  The 10-year ASCVD risk score (Arnett DK, et al., 2019) is: 7.8%   Values used to calculate the score:     Age: 52 years     Sex: Male     Is Non-Hispanic African American: No     Diabetic: No     Tobacco smoker: No     Systolic Blood Pressure: 124 mmHg     Is BP treated: Yes     HDL Cholesterol: 48 mg/dL     Total Cholesterol: 271 mg/dL  Please call lab and see if we can add on a free T4

## 2023-10-14 ENCOUNTER — Other Ambulatory Visit: Payer: Self-pay | Admitting: Family Medicine

## 2023-10-14 LAB — SPECIMEN STATUS REPORT

## 2023-10-14 LAB — THYROID PEROXIDASE ANTIBODY: Thyroperoxidase Ab SerPl-aCnc: 83 [IU]/mL — ABNORMAL HIGH (ref 0–34)

## 2023-10-14 MED ORDER — TESTOSTERONE 20.25 MG/ACT (1.62%) TD GEL: TRANSDERMAL | 1 refills | Status: DC

## 2023-10-14 NOTE — Progress Notes (Signed)
Meds ordered this encounter  Medications   Testosterone 20.25 MG/ACT (1.62%) GEL    Sig: 1 pump to each outer arm QAM.    Dispense:  75 g    Refill:  1

## 2023-10-14 NOTE — Progress Notes (Signed)
Hi [place], the Antibody level looks much better.  Down to 65 which is great!!  Are you taking selenium?  This can help as well.

## 2023-10-15 LAB — SPECIMEN STATUS REPORT

## 2023-10-15 LAB — TESTOSTERONE, FREE, TOTAL, SHBG
Sex Hormone Binding: 16.7 nmol/L — ABNORMAL LOW (ref 19.3–76.4)
Testosterone, Free: 6.1 pg/mL — ABNORMAL LOW (ref 7.2–24.0)
Testosterone: 193 ng/dL — ABNORMAL LOW (ref 264–916)

## 2023-10-15 LAB — THYROID PEROXIDASE ANTIBODY: Thyroperoxidase Ab SerPl-aCnc: 112 [IU]/mL — ABNORMAL HIGH (ref 0–34)

## 2023-10-21 ENCOUNTER — Encounter: Payer: Self-pay | Admitting: Family Medicine

## 2023-11-02 ENCOUNTER — Other Ambulatory Visit: Payer: Self-pay | Admitting: Family Medicine

## 2023-11-04 ENCOUNTER — Encounter: Payer: Self-pay | Admitting: Family Medicine

## 2023-11-08 ENCOUNTER — Telehealth: Payer: Self-pay

## 2023-11-08 NOTE — Telephone Encounter (Addendum)
Initiated Prior authorization AVW:[id number] 20.25 MG/ACT(1.62%) gel Via: Covermymeds Case/Key:[id number] Status: approved  as of 11/08/23 Reason:Authorization Expiration Date: 11/06/2024  This has been approved for a max daily dosage of 5.0 Notified Pt via: Mychart

## 2023-12-25 ENCOUNTER — Other Ambulatory Visit: Payer: Self-pay | Admitting: Family Medicine

## 2023-12-30 NOTE — Telephone Encounter (Signed)
Last OV 10/11/2023 Next OV 04/10/2024  Labs UTD 10/12/2023

## 2024-02-09 ENCOUNTER — Other Ambulatory Visit: Payer: Self-pay | Admitting: Family Medicine

## 2024-03-14 ENCOUNTER — Other Ambulatory Visit: Payer: Self-pay | Admitting: Family Medicine

## 2024-03-16 NOTE — Telephone Encounter (Signed)
 Last OV: 10/11/23 Next OV: 04/10/24 LABS: 10/12/23, Low Last RF: 12/30/23

## 2024-04-10 ENCOUNTER — Ambulatory Visit: Payer: Managed Care, Other (non HMO) | Admitting: Family Medicine

## 2024-04-10 VITALS — BP 128/86 | HR 63 | Ht 67.0 in | Wt 200.0 lb

## 2024-04-10 DIAGNOSIS — I10 Essential (primary) hypertension: Secondary | ICD-10-CM

## 2024-04-10 DIAGNOSIS — R42 Dizziness and giddiness: Secondary | ICD-10-CM | POA: Insufficient documentation

## 2024-04-10 DIAGNOSIS — H5509 Other forms of nystagmus: Secondary | ICD-10-CM | POA: Diagnosis not present

## 2024-04-10 DIAGNOSIS — E291 Testicular hypofunction: Secondary | ICD-10-CM

## 2024-04-10 NOTE — Assessment & Plan Note (Signed)
 Sxs consistant with BBPV, right. Will refer for vestibular rehab.  Also since nystagmys is vertical will get brain MRI for further workup.

## 2024-04-10 NOTE — Assessment & Plan Note (Signed)
 Well controlled. Continue current regimen. Follow up in  6 mo

## 2024-04-10 NOTE — Progress Notes (Signed)
 Established Patient Office Visit  Subjective  Patient ID: Robert Aguilar, male    DOB: May 21, 1971  Age: 53 y.o. MRN: [id number]  Chief Complaint  Patient presents with   Hypertension    HPI Hypertension- Pt denies chest pain, SOB, dizziness, or heart palpitations.  Taking meds as directed w/o problems.  Denies medication side effects.    Hypogonadism- started the testosterone  but hasn't used recently. Had friend of family mention cancer concerns and a friend was recently dx with prostate Ca.    Had URI about 4 mo ago  - took prednisone .  Took 20mg  one day and then 10 mg next day. Stop it. Because started feeling like room was spinning esp when rolling over in bed and getting out of bed.  Still present daily.  No recent changes.      ROS    Objective:     BP 128/86   Pulse 63   Ht 5\' 7"  (1.702 m)   Wt 200 lb (90.7 kg)   SpO2 99%   BMI 31.32 kg/m    Physical Exam Vitals and nursing note reviewed.  Constitutional:      Appearance: Normal appearance.  HENT:     Head: Normocephalic and atraumatic.     Right Ear: Tympanic membrane, ear canal and external ear normal.     Left Ear: Tympanic membrane, ear canal and external ear normal.  Eyes:     Conjunctiva/sclera: Conjunctivae normal.  Cardiovascular:     Rate and Rhythm: Normal rate and regular rhythm.  Pulmonary:     Effort: Pulmonary effort is normal.     Breath sounds: Normal breath sounds.  Musculoskeletal:     Comments: Diks hallpike maneuver pos for vertical nystamus to the right.   Skin:    General: Skin is warm and dry.  Neurological:     General: No focal deficit present.     Mental Status: He is alert and oriented to person, place, and time.     Cranial Nerves: No cranial nerve deficit.     Comments: Diks-hallpike maneuver is Pos to the right with vertical nystagmus, upbeating.   Psychiatric:        Mood and Affect: Mood normal.     No results found for any visits on 04/10/24.    The 10-year  ASCVD risk score (Arnett DK, et al., 2019) is: 8.9%    Assessment & Plan:   Problem List Items Addressed This Visit       Cardiovascular and Mediastinum   Essential hypertension, benign - Primary   Well controlled. Continue current regimen. Follow up in 6 mo       Relevant Orders   CBC with Differential/Platelet   CMP14+EGFR   PSA     Endocrine   Hypogonadism in male   Discussed pros and cons of replacement therapy. He is off med currently so no labs drawn. If decided to restart med then will need labs done on one month.    F/U in 6 months to dertermine if symptoms are managed well on replacement therapy.       Relevant Orders   CBC with Differential/Platelet   CMP14+EGFR   PSA     Other   Vertigo   Sxs consistant with BBPV, right. Will refer for vestibular rehab.  Also since nystagmys is vertical will get brain MRI for further workup.       Other Visit Diagnoses       Vertical nystagmus  Relevant Orders   Ambulatory referral to Physical Therapy   MR Brain W Wo Contrast       Return in about 6 months (around 10/10/2024) for bp.    Duaine German, MD

## 2024-04-10 NOTE — Assessment & Plan Note (Signed)
 Discussed pros and cons of replacement therapy. He is off med currently so no labs drawn. If decided to restart med then will need labs done on one month.    F/U in 6 months to dertermine if symptoms are managed well on replacement therapy.

## 2024-04-11 ENCOUNTER — Encounter: Payer: Self-pay | Admitting: Family Medicine

## 2024-04-11 NOTE — Telephone Encounter (Signed)
 Patient is requesting for MRI to be done at a new location. Thanks in advance.

## 2024-04-13 NOTE — Telephone Encounter (Signed)
 Patient is checking up on the auth status for MRI. Scheduled on Monday 04/17/24. Thanks in advance.

## 2024-04-17 ENCOUNTER — Other Ambulatory Visit

## 2024-05-01 ENCOUNTER — Ambulatory Visit: Payer: Self-pay | Admitting: Family Medicine

## 2024-05-01 NOTE — Progress Notes (Signed)
 Hi Robert Aguilar, great news MRI looks good so no worrisome or concerning findings.  Hopefully you have been able to get in with physical therapy to start doing some vestibular rehab I think that is can make the biggest difference in your symptoms.

## 2024-05-09 ENCOUNTER — Encounter: Payer: Self-pay | Admitting: Family Medicine

## 2024-05-09 NOTE — Telephone Encounter (Signed)
 Please update the patient regarding the PT referral for [facility]. Thanks in advance.

## 2024-05-10 ENCOUNTER — Other Ambulatory Visit: Payer: Self-pay | Admitting: Family Medicine

## 2024-05-24 ENCOUNTER — Other Ambulatory Visit: Payer: Self-pay | Admitting: Family Medicine

## 2024-05-26 NOTE — Telephone Encounter (Signed)
 I know he has an appointment in October but he was supposed to come and get labs for testosterone  in April and has not come by he needs to come and get that done.

## 2024-05-26 NOTE — Telephone Encounter (Signed)
 Lvm for pt

## 2024-05-26 NOTE — Telephone Encounter (Signed)
 Last fill: 03/16/24;  Dispense Quantity: 75 g Refills: 0   Last OV: 04/10/24

## 2024-05-27 LAB — CBC WITH DIFFERENTIAL/PLATELET
Basophils Absolute: 0.1 10*3/uL (ref 0.0–0.2)
Basos: 1 %
EOS (ABSOLUTE): 0.2 10*3/uL (ref 0.0–0.4)
Eos: 3 %
Hematocrit: 42.8 % (ref 37.5–51.0)
Hemoglobin: 14.5 g/dL (ref 13.0–17.7)
Immature Grans (Abs): 0 10*3/uL (ref 0.0–0.1)
Immature Granulocytes: 0 %
Lymphocytes Absolute: 1.7 10*3/uL (ref 0.7–3.1)
Lymphs: 26 %
MCH: 31.5 pg (ref 26.6–33.0)
MCHC: 33.9 g/dL (ref 31.5–35.7)
MCV: 93 fL (ref 79–97)
Monocytes Absolute: 0.8 10*3/uL (ref 0.1–0.9)
Monocytes: 12 %
Neutrophils Absolute: 3.7 10*3/uL (ref 1.4–7.0)
Neutrophils: 57 %
Platelets: 277 10*3/uL (ref 150–450)
RBC: 4.61 x10E6/uL (ref 4.14–5.80)
RDW: 12.8 % (ref 11.6–15.4)
WBC: 6.5 10*3/uL (ref 3.4–10.8)

## 2024-05-27 LAB — CMP14+EGFR
ALT: 44 IU/L (ref 0–44)
AST: 34 IU/L (ref 0–40)
Albumin: 4.7 g/dL (ref 3.8–4.9)
Alkaline Phosphatase: 80 IU/L (ref 44–121)
BUN/Creatinine Ratio: 12 (ref 9–20)
BUN: 10 mg/dL (ref 6–24)
Bilirubin Total: 0.3 mg/dL (ref 0.0–1.2)
CO2: 24 mmol/L (ref 20–29)
Calcium: 9.6 mg/dL (ref 8.7–10.2)
Chloride: 101 mmol/L (ref 96–106)
Creatinine, Ser: 0.82 mg/dL (ref 0.76–1.27)
Globulin, Total: 2.6 g/dL (ref 1.5–4.5)
Glucose: 78 mg/dL (ref 70–99)
Potassium: 4.1 mmol/L (ref 3.5–5.2)
Sodium: 140 mmol/L (ref 134–144)
Total Protein: 7.3 g/dL (ref 6.0–8.5)
eGFR: 104 mL/min/{1.73_m2} (ref >59)

## 2024-05-27 LAB — PSA: Prostate Specific Ag, Serum: 1.9 ng/mL (ref 0.0–4.0)

## 2024-05-29 ENCOUNTER — Ambulatory Visit: Payer: Self-pay | Admitting: Family Medicine

## 2024-05-29 NOTE — Progress Notes (Signed)
 Your lab work is within acceptable range and there are no concerning findings.   ?

## 2024-05-29 NOTE — Telephone Encounter (Signed)
 Please call pharmacy and let them know we have not sent it back because we have been trying to get in touch with the patient because he is way overdue for lab work and he was supposed to have that done a couple months ago.

## 2024-05-30 NOTE — Telephone Encounter (Signed)
 [facility] updated of [facility]'s [facility]. The pharmacy will pass on the message regarding the overdue labs. The pharmacist has cancelled the refill for Testosterone  1.62% gel pump.

## 2024-05-30 NOTE — Telephone Encounter (Signed)
 I called the pharmacy and advised them that patient needs labs.

## 2024-07-10 ENCOUNTER — Encounter: Payer: Self-pay | Admitting: Family Medicine

## 2024-07-10 DIAGNOSIS — E291 Testicular hypofunction: Secondary | ICD-10-CM

## 2024-07-11 MED ORDER — TESTOSTERONE 20.25 MG/ACT (1.62%) TD GEL: TRANSDERMAL | 3 refills | Status: AC

## 2024-09-13 ENCOUNTER — Ambulatory Visit: Admitting: Family Medicine

## 2024-09-13 ENCOUNTER — Encounter: Payer: Self-pay | Admitting: Family Medicine

## 2024-09-13 VITALS — BP 129/81 | HR 73 | Ht 67.0 in | Wt 204.1 lb

## 2024-09-13 DIAGNOSIS — R0602 Shortness of breath: Secondary | ICD-10-CM

## 2024-09-13 DIAGNOSIS — D229 Melanocytic nevi, unspecified: Secondary | ICD-10-CM | POA: Diagnosis not present

## 2024-09-13 DIAGNOSIS — R0789 Other chest pain: Secondary | ICD-10-CM | POA: Diagnosis not present

## 2024-09-13 DIAGNOSIS — F411 Generalized anxiety disorder: Secondary | ICD-10-CM

## 2024-09-13 DIAGNOSIS — R748 Abnormal levels of other serum enzymes: Secondary | ICD-10-CM

## 2024-09-13 MED ORDER — CLONAZEPAM 0.5 MG PO TABS: 0.5000 mg | ORAL_TABLET | Freq: Every day | ORAL | 0 refills | Status: AC | PRN

## 2024-09-13 NOTE — Progress Notes (Signed)
 Established Patient Office Visit  Subjective  Patient ID: Robert Aguilar, male    DOB: [birth year]  Age: 53 y.o. MRN: [id number]  Chief Complaint  Patient presents with   Follow-up    Atypical chest pain    HPI  Here today for recent ED visit follow-up.  He was seen at [facility] on September 26 for mild chest pressure that has started about 4 days prior on Tuesday he also was experiencing some tingling in the jaw on the left arm and some intermittent shortness of breath. EKG stable. CXR normal. Labs troponins were normal.  CMP was normal except for slightly elevated ALT only off by 1 point.  CBC normal.  Magnesium normal.  Has f/u Cardiology tomorrow.   Discussed the use of AI scribe software for clinical note transcription with the patient, who gave verbal consent to proceed.  History of Present Illness Robert Aguilar is a 53 year old male who presents with ongoing chest pressure and anxiety.  Chest pain and pressure - Ongoing episodes of sudden, non-constant chest pressure and pain since a recent emergency department visit - Symptoms are alleviated by breathing exercises but can occur unexpectedly - Previous cardiac workup, including a calcium score two to three years ago, showed a score of zero, indicating no significant plaque burden at that time - Significant family history of heart disease; oldest brother died from a massive myocardial infarction at age 23 with risk factors of uncontrolled diabetes, heavy smoking, and alcohol use  Anxiety - Experiences anxiety related to health, particularly due to family history and similarity in age to his brother at the time of his death - Currently taking Rexulti at starting dose for anxiety management - Has not taken Adderall for the past six months - Attempted to contact previous therapist, who may have retired, and is considering a recommendation for a new therapist  Fatigue and dyspnea - Experiences fatigue and shortness of  breath during mundane activities, such as getting into bed, but not during exercise or physical activity - Resumed walking on the treadmill in the past few weeks but continues to feel fatigued - Attributes some shortness of breath to abdominal size, possibly compressing the diaphragm  Diet and physical activity - Acknowledges need to improve diet but finds it challenging due to family dynamics and preferences - Has been trying to eat healthier and has resumed some physical activity  Laboratory findings - Recent mild elevation in liver enzymes      ROS    Objective:     BP 129/81   Pulse 73   Ht 5' 7 (1.702 m)   Wt 204 lb 1.9 oz (92.6 kg)   SpO2 98%   BMI 31.97 kg/m    Physical Exam Vitals and nursing note reviewed.  Constitutional:      Appearance: Normal appearance.  HENT:     Head: Normocephalic and atraumatic.  Eyes:     Conjunctiva/sclera: Conjunctivae normal.  Cardiovascular:     Rate and Rhythm: Normal rate and regular rhythm.  Pulmonary:     Effort: Pulmonary effort is normal.     Breath sounds: Normal breath sounds.  Skin:    General: Skin is warm and dry.  Neurological:     Mental Status: He is alert.  Psychiatric:        Mood and Affect: Mood normal.      No results found for any visits on 09/13/24.    The 10-year ASCVD risk score (Arnett  DK, et al., 2019) is: 9%    Assessment & Plan:   Problem List Items Addressed This Visit   None Visit Diagnoses       Atypical chest pain    -  Primary     GAD (generalized anxiety disorder)         Atypical nevus         Elevated liver enzymes         SOB (shortness of breath)          Assessment and Plan Assessment & Plan Chest pain and pressure Intermittent chest pain likely anxiety-related. Previous ED visit ruled out acute myocardial infarction. Family history of heart disease. Previous cardiac workup indicates low coronary artery disease risk. - Attend cardiology consultation scheduled  for tomorrow. - Discuss potential for a stress test or cardiac calcium score with cardiologist. - Review previous cardiac workup results with cardiologist.  Anxiety disorder Anxiety contributing to chest pain. Currently on Rexulti, considering dose adjustment. Seeking therapy after previous therapist retired. - Consult with psychiatrist about potential increase in Rexulti dosage. - Contact recommended therapist for therapy sessions. - Consider prescribing clonazepam  for acute anxiety episodes, pending psychiatrist's approval.  Shortness of breath and fatigue Shortness of breath and fatigue likely non-cardiac, possibly due to abdominal pressure on diaphragm from body habitus. - Continue regular exercise regimen, including treadmill walking. - Monitor symptoms and report any changes.  Elevated liver enzyme Mild elevation in liver enzyme, possibly dietary related. Requires monitoring. - Recheck liver enzyme levels in three months. - Encourage dietary modifications to reduce liver inflammation.  Mole, planned removal Mole removal planned for next appointment. Procedure involves numbing and shaving off the mole, followed by lab analysis. - Proceed with mole removal at the next appointment on October 28. - Send mole for histopathological examination post-removal.    No follow-ups on file.    Dorothyann Byars, MD

## 2024-09-15 ENCOUNTER — Encounter: Payer: Self-pay | Admitting: Family Medicine

## 2024-10-04 ENCOUNTER — Ambulatory Visit: Payer: Self-pay | Admitting: Family Medicine

## 2024-10-04 DIAGNOSIS — R002 Palpitations: Secondary | ICD-10-CM

## 2024-10-10 ENCOUNTER — Other Ambulatory Visit (HOSPITAL_COMMUNITY)
Admission: RE | Admit: 2024-10-10 | Discharge: 2024-10-10 | Disposition: A | Discharge status: Home / Self Care | Source: Ambulatory Visit | Attending: Family Medicine | Admitting: Family Medicine

## 2024-10-10 ENCOUNTER — Ambulatory Visit: Admitting: Family Medicine

## 2024-10-10 VITALS — BP 128/77 | HR 70 | Ht 67.0 in | Wt 203.1 lb

## 2024-10-10 DIAGNOSIS — D229 Melanocytic nevi, unspecified: Secondary | ICD-10-CM

## 2024-10-10 DIAGNOSIS — E038 Other specified hypothyroidism: Secondary | ICD-10-CM

## 2024-10-10 DIAGNOSIS — R7989 Other specified abnormal findings of blood chemistry: Secondary | ICD-10-CM | POA: Diagnosis not present

## 2024-10-10 MED ORDER — LEVOTHYROXINE SODIUM 25 MCG PO TABS: 25.0000 ug | ORAL_TABLET | Freq: Every day | ORAL | 0 refills | Status: DC

## 2024-10-10 NOTE — Assessment & Plan Note (Signed)
 Will go ahead and start low-dose thyroid  medication since he has been symptomatic with some shortness of breath etc.  And level was still elevated when cardiology rechecked.  Will start levothyroxine  25 mcg daily plan to recheck TSH in 6 to 8 weeks.  Assess efficacy of the medication.

## 2024-10-10 NOTE — Progress Notes (Signed)
   Established Patient Office Visit  Patient ID: Robert Aguilar, male    DOB: 1971-08-03  Age: 53 y.o. MRN: [id number] PCP: Alvan Dorothyann BIRCH, MD  Chief Complaint  Patient presents with   mole removal    L upper arm    Subjective:     HPI  Here for skin lesion removal. Mentioned at last OV.  Darker than other moles. Located on left upper inner arm.   Also f/u TSH.  Previous levels have been slightly elevated in the subclinical range. Repeat performed by [facility] at [facility]   Component Ref Range & Units 3 wk ago  TSH 0.450 - 4.50 uIU/mL 6.720 High   Free T4 0.82 - 1.77 ng/dL 8.94      ROS    Objective:     BP 128/77   Pulse 70   Ht 5' 7 (1.702 m)   Wt 203 lb 1.3 oz (92.1 kg)   SpO2 99%   BMI 31.81 kg/m    Physical Exam   No results found for any visits on 10/10/24.    The 10-year ASCVD risk score (Arnett DK, et al., 2019) is: 8.9%    Assessment & Plan:   Problem List Items Addressed This Visit       Endocrine   Subclinical hypothyroidism   Will go ahead and start low-dose thyroid  medication since he has been symptomatic with some shortness of breath etc.  And level was still elevated when cardiology rechecked.  Will start levothyroxine  25 mcg daily plan to recheck TSH in 6 to 8 weeks.  Assess efficacy of the medication.      Relevant Medications   levothyroxine  (SYNTHROID ) 25 MCG tablet   Other Relevant Orders   TSH   Other Visit Diagnoses       Atypical nevus    -  Primary   Relevant Orders   Surgical pathology     Abnormal TSH           Assessment and Plan Assessment & Plan   Skin excision  Date/Time: 10/10/2024 10:06 AM  Performed by: Alvan Dorothyann BIRCH, MD Authorized by: Alvan Dorothyann BIRCH, MD   Number of Lesions: 1 Lesion 1:    Body area: upper extremity   Upper extremity location: L upper arm   Initial size (mm): 4   Final defect size (mm): 5   Malignancy: malignancy unknown    Comments:  Shave bx  performed on dark pigmented round raise lesion.  Anesthetized with 1% lidocaine with epi after cleaning with Chlorprep.   Follow-up wound care instructions reviewed.  Okay to wash with Dial soap and water.  Do not use alcohol and peroxide.  Pat dry apply Vaseline and keep covered for several days and once it starting to heal keep well moisturized for couple of weeks.  Call if any concerns with the appearance of the wound.  Return for tsh labs in 6-8 weeks.    [name], MD [facility] at [facility]

## 2024-10-13 LAB — SURGICAL PATHOLOGY

## 2024-10-16 ENCOUNTER — Ambulatory Visit: Payer: Self-pay | Admitting: Family Medicine

## 2024-10-16 NOTE — Progress Notes (Signed)
 HI Parry, the biopsy showed just an irritated seborrheic keratosis.  These are considered but benign which is fantastic news so no further treatment needed.

## 2024-11-05 ENCOUNTER — Other Ambulatory Visit: Payer: Self-pay | Admitting: Family Medicine

## 2024-11-17 ENCOUNTER — Telehealth: Payer: Self-pay

## 2024-11-17 ENCOUNTER — Other Ambulatory Visit (HOSPITAL_COMMUNITY): Payer: Self-pay

## 2024-11-17 NOTE — Telephone Encounter (Signed)
 Pharmacy Patient Advocate Encounter   Received notification from [employer]base that prior authorization for Testosterone  20.25 MG/ACT(1.62%) gel is required/requested.   Insurance verification completed.   The patient is insured through [facility].   Per test claim: PA required; PA submitted to above mentioned insurance via Latent Key/confirmation #/EOC [id number] Status is pending

## 2024-11-17 NOTE — Telephone Encounter (Signed)
 Pharmacy Patient Advocate Encounter  Received notification from [facility] that Prior Authorization for Testosterone  20.25 MG/ACT(1.62%) gel has been APPROVED from 11/17/24 to 11/16/25. Ran test claim, Copay is $0. This test claim was processed through [facility]- copay amounts may vary at other pharmacies due to pharmacy/plan contracts, or as the patient moves through the different stages of their insurance plan.   PA #/Case ID/Reference #: [id number]

## 2024-11-27 ENCOUNTER — Encounter: Payer: Self-pay | Admitting: Family Medicine

## 2024-11-30 LAB — TSH: TSH: 5.15 u[IU]/mL — ABNORMAL HIGH (ref 0.450–4.500)

## 2024-11-30 MED ORDER — LEVOTHYROXINE SODIUM 50 MCG PO TABS: 50.0000 ug | ORAL_TABLET | Freq: Every day | ORAL | 0 refills | Status: AC

## 2024-11-30 NOTE — Progress Notes (Signed)
 Hi [name], TSH is still elevated around 5.1 add like to increase your thyroid  medication to 50 mcg I will send over new updated prescription and then recommend that we recheck your TSH in about 8 weeks.
